# Patient Record
Sex: Female | Born: 1960 | Race: White | Hispanic: No | Marital: Single | Smoking: Former smoker
Health system: Southern US, Community
[De-identification: ages and names within clinical notes are randomized; demographics above are authoritative.]

## PROBLEM LIST (undated history)

## (undated) DIAGNOSIS — Z951 Presence of aortocoronary bypass graft: Secondary | ICD-10-CM

## (undated) DIAGNOSIS — I2510 Atherosclerotic heart disease of native coronary artery without angina pectoris: Secondary | ICD-10-CM

## (undated) DIAGNOSIS — I639 Cerebral infarction, unspecified: Secondary | ICD-10-CM

## (undated) DIAGNOSIS — I10 Essential (primary) hypertension: Secondary | ICD-10-CM

## (undated) HISTORY — PX: CARDIAC SURGERY: SHX584

---

## 2013-04-02 HISTORY — PX: CORONARY ARTERY BYPASS GRAFT: SHX141

## 2013-05-10 ENCOUNTER — Emergency Department (HOSPITAL_BASED_OUTPATIENT_CLINIC_OR_DEPARTMENT_OTHER)
Admission: EM | Admit: 2013-05-10 | Discharge: 2013-05-10 | Disposition: A | Discharge status: Home / Self Care | Payer: No Typology Code available for payment source | Attending: Emergency Medicine | Admitting: Emergency Medicine

## 2013-05-10 ENCOUNTER — Emergency Department (HOSPITAL_BASED_OUTPATIENT_CLINIC_OR_DEPARTMENT_OTHER): Payer: No Typology Code available for payment source

## 2013-05-10 ENCOUNTER — Encounter (HOSPITAL_BASED_OUTPATIENT_CLINIC_OR_DEPARTMENT_OTHER): Payer: Self-pay | Admitting: Emergency Medicine

## 2013-05-10 DIAGNOSIS — Z79899 Other long term (current) drug therapy: Secondary | ICD-10-CM | POA: Insufficient documentation

## 2013-05-10 DIAGNOSIS — IMO0002 Reserved for concepts with insufficient information to code with codable children: Secondary | ICD-10-CM | POA: Insufficient documentation

## 2013-05-10 DIAGNOSIS — S39012A Strain of muscle, fascia and tendon of lower back, initial encounter: Secondary | ICD-10-CM

## 2013-05-10 DIAGNOSIS — S0993XA Unspecified injury of face, initial encounter: Secondary | ICD-10-CM | POA: Insufficient documentation

## 2013-05-10 DIAGNOSIS — S199XXA Unspecified injury of neck, initial encounter: Secondary | ICD-10-CM

## 2013-05-10 DIAGNOSIS — S8000XA Contusion of unspecified knee, initial encounter: Secondary | ICD-10-CM | POA: Insufficient documentation

## 2013-05-10 DIAGNOSIS — I10 Essential (primary) hypertension: Secondary | ICD-10-CM | POA: Insufficient documentation

## 2013-05-10 DIAGNOSIS — Y9389 Activity, other specified: Secondary | ICD-10-CM | POA: Insufficient documentation

## 2013-05-10 DIAGNOSIS — Y92410 Unspecified street and highway as the place of occurrence of the external cause: Secondary | ICD-10-CM | POA: Insufficient documentation

## 2013-05-10 HISTORY — DX: Essential (primary) hypertension: I10

## 2013-05-10 MED ORDER — OXYCODONE-ACETAMINOPHEN 5-325 MG PO TABS: 2.0000 | ORAL_TABLET | ORAL | Status: DC | PRN

## 2013-05-10 MED ORDER — CYCLOBENZAPRINE HCL 10 MG PO TABS: 10.0000 mg | ORAL_TABLET | Freq: Two times a day (BID) | ORAL | Status: DC | PRN

## 2013-05-10 MED ORDER — IBUPROFEN 600 MG PO TABS: 600.0000 mg | ORAL_TABLET | Freq: Four times a day (QID) | ORAL | Status: DC | PRN
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
## 2013-05-10 NOTE — ED Notes (Signed)
Apologized for extended wait times.  Comfort measures provided to patient.

## 2013-05-10 NOTE — ED Notes (Signed)
MD at bedside. 
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
## 2015-12-21 ENCOUNTER — Encounter (HOSPITAL_BASED_OUTPATIENT_CLINIC_OR_DEPARTMENT_OTHER): Payer: Self-pay

## 2015-12-21 ENCOUNTER — Emergency Department (HOSPITAL_BASED_OUTPATIENT_CLINIC_OR_DEPARTMENT_OTHER)
Admission: EM | Admit: 2015-12-21 | Discharge: 2015-12-21 | Disposition: A | Discharge status: Home / Self Care | Payer: Self-pay | Attending: Emergency Medicine | Admitting: Emergency Medicine

## 2015-12-21 ENCOUNTER — Emergency Department (HOSPITAL_BASED_OUTPATIENT_CLINIC_OR_DEPARTMENT_OTHER): Payer: Self-pay

## 2015-12-21 DIAGNOSIS — R1011 Right upper quadrant pain: Secondary | ICD-10-CM

## 2015-12-21 DIAGNOSIS — K8051 Calculus of bile duct without cholangitis or cholecystitis with obstruction: Secondary | ICD-10-CM | POA: Insufficient documentation

## 2015-12-21 DIAGNOSIS — K8050 Calculus of bile duct without cholangitis or cholecystitis without obstruction: Secondary | ICD-10-CM

## 2015-12-21 DIAGNOSIS — Z79899 Other long term (current) drug therapy: Secondary | ICD-10-CM | POA: Insufficient documentation

## 2015-12-21 DIAGNOSIS — N390 Urinary tract infection, site not specified: Secondary | ICD-10-CM | POA: Insufficient documentation

## 2015-12-21 DIAGNOSIS — I10 Essential (primary) hypertension: Secondary | ICD-10-CM | POA: Insufficient documentation

## 2015-12-21 DIAGNOSIS — Z87891 Personal history of nicotine dependence: Secondary | ICD-10-CM | POA: Insufficient documentation

## 2015-12-21 HISTORY — DX: Cerebral infarction, unspecified: I63.9

## 2015-12-21 HISTORY — DX: Presence of aortocoronary bypass graft: Z95.1

## 2015-12-21 HISTORY — DX: Atherosclerotic heart disease of native coronary artery without angina pectoris: I25.10

## 2015-12-21 LAB — COMPREHENSIVE METABOLIC PANEL
ALBUMIN: 4.1 g/dL (ref 3.5–5.0)
ALK PHOS: 70 U/L (ref 38–126)
ALT: 20 U/L (ref 14–54)
AST: 24 U/L (ref 15–41)
Anion gap: 7 (ref 5–15)
BUN: 14 mg/dL (ref 6–20)
CALCIUM: 9.4 mg/dL (ref 8.9–10.3)
CHLORIDE: 106 mmol/L (ref 101–111)
CO2: 27 mmol/L (ref 22–32)
CREATININE: 0.96 mg/dL (ref 0.44–1.00)
GFR calc Af Amer: >60 mL/min (ref >60)
GFR calc non Af Amer: >60 mL/min (ref >60)
GLUCOSE: 98 mg/dL (ref 65–99)
Potassium: 3.3 mmol/L — ABNORMAL LOW (ref 3.5–5.1)
Sodium: 140 mmol/L (ref 135–145)
Total Bilirubin: 0.2 mg/dL — ABNORMAL LOW (ref 0.3–1.2)
Total Protein: 7.1 g/dL (ref 6.5–8.1)

## 2015-12-21 LAB — URINALYSIS, ROUTINE W REFLEX MICROSCOPIC
BILIRUBIN URINE: NEGATIVE
Glucose, UA: NEGATIVE mg/dL
Hgb urine dipstick: NEGATIVE
Ketones, ur: NEGATIVE mg/dL
NITRITE: POSITIVE — AB
PH: 5.5 (ref 5.0–8.0)
Protein, ur: NEGATIVE mg/dL
SPECIFIC GRAVITY, URINE: 1.014 (ref 1.005–1.030)

## 2015-12-21 LAB — CBC WITH DIFFERENTIAL/PLATELET
BASOS ABS: 0 10*3/uL (ref 0.0–0.1)
Basophils Relative: 0 %
Eosinophils Absolute: 0.3 10*3/uL (ref 0.0–0.7)
Eosinophils Relative: 5 %
HCT: 39.9 % (ref 36.0–46.0)
HEMOGLOBIN: 12.8 g/dL (ref 12.0–15.0)
LYMPHS ABS: 2 10*3/uL (ref 0.7–4.0)
Lymphocytes Relative: 40 %
MCH: 25.4 pg — ABNORMAL LOW (ref 26.0–34.0)
MCHC: 32.1 g/dL (ref 30.0–36.0)
MCV: 79.2 fL (ref 78.0–100.0)
Monocytes Absolute: 0.5 10*3/uL (ref 0.1–1.0)
Monocytes Relative: 9 %
Neutro Abs: 2.2 10*3/uL (ref 1.7–7.7)
Neutrophils Relative %: 46 %
PLATELETS: 213 10*3/uL (ref 150–400)
RBC: 5.04 MIL/uL (ref 3.87–5.11)
RDW: 15.5 % (ref 11.5–15.5)
WBC: 5 10*3/uL (ref 4.0–10.5)

## 2015-12-21 LAB — LIPASE, BLOOD: Lipase: 31 U/L (ref 11–51)

## 2015-12-21 LAB — URINE MICROSCOPIC-ADD ON

## 2015-12-21 LAB — MAGNESIUM: Magnesium: 1.8 mg/dL (ref 1.7–2.4)

## 2015-12-21 MED ORDER — ONDANSETRON HCL 4 MG PO TABS: 4.0000 mg | ORAL_TABLET | Freq: Three times a day (TID) | ORAL | 0 refills | Status: DC | PRN

## 2015-12-21 MED ORDER — CEPHALEXIN 500 MG PO CAPS: 500.0000 mg | ORAL_CAPSULE | Freq: Three times a day (TID) | ORAL | 0 refills | Status: DC

## 2015-12-21 MED ORDER — OXYCODONE-ACETAMINOPHEN 5-325 MG PO TABS: 1.0000 | ORAL_TABLET | ORAL | 0 refills | Status: DC | PRN

## 2015-12-21 MED ORDER — MORPHINE SULFATE (PF) 4 MG/ML IV SOLN
4.0000 mg | Freq: Once | INTRAVENOUS | Status: AC
  Administered 2015-12-21: 4 mg via INTRAVENOUS
  Filled 2015-12-21: qty 1

## 2015-12-21 MED ORDER — SODIUM CHLORIDE 0.9 % IV BOLUS (SEPSIS)
500.0000 mL | Freq: Once | INTRAVENOUS | Status: AC
  Administered 2015-12-21: 500 mL via INTRAVENOUS

## 2015-12-21 MED ORDER — DEXTROSE 5 % IV SOLN
1.0000 g | Freq: Once | INTRAVENOUS | Status: AC
  Administered 2015-12-21: 1 g via INTRAVENOUS
  Filled 2015-12-21: qty 10

## 2015-12-21 MED ORDER — ONDANSETRON HCL 4 MG/2ML IJ SOLN
4.0000 mg | Freq: Once | INTRAMUSCULAR | Status: AC
  Administered 2015-12-21: 4 mg via INTRAVENOUS
  Filled 2015-12-21: qty 2

## 2015-12-21 NOTE — ED Notes (Signed)
MD at bedside. 

## 2015-12-21 NOTE — ED Notes (Signed)
Patient transported to Ultrasound 

## 2015-12-21 NOTE — ED Provider Notes (Signed)
[facility]   CSN: [id number] Arrival date & time: 12/21/15  [id number]     History   Chief Complaint Chief Complaint  Patient presents with  . Abdominal Pain    HPI Linda Pruitt is a 55 y.o. female.  HPI Patient presents with right upper quadrant pain radiating to her right flank. States this is the same pain as her previously diagnosed biliary colic. States she is scheduled to have cholecystectomy in 5 days with Dr. Rubye OaksPalmer. Complains of nausea but no vomiting. No fever or chills. Took her hydrocodone at home with little relief. Last ate at 8 PM last night. States she's seen her cardiologist and receive cardiac clearance. Past Medical History:  Diagnosis Date  . Coronary artery disease   . Hx of CABG   . Hypertension   . Stroke [facility](HCC)     There are no active problems to display for this patient.   Past Surgical History:  Procedure Laterality Date  . CARDIAC SURGERY      OB History    No data available       Home Medications    Prior to Admission medications   Medication Sig Start Date End Date Taking? Authorizing Provider  cephALEXin (KEFLEX) 500 MG capsule Take 1 capsule (500 mg total) by mouth 3 (three) times daily. 12/21/15   Loren Raceravid Reeve Mallo, MD  cyclobenzaprine (FLEXERIL) 10 MG tablet Take 1 tablet (10 mg total) by mouth 2 (two) times daily as needed for muscle spasms. 05/10/13   Nelva Nayobert Beaton, MD  ibuprofen (ADVIL,MOTRIN) 600 MG tablet Take 1 tablet (600 mg total) by mouth every 6 (six) hours as needed. 05/10/13   Nelva Nayobert Beaton, MD  lisinopril (PRINIVIL,ZESTRIL) 20 MG tablet Take 20 mg by mouth daily.    Historical Provider, MD  ondansetron (ZOFRAN) 4 MG tablet Take 1 tablet (4 mg total) by mouth every 8 (eight) hours as needed for nausea or vomiting. 12/21/15   Loren Raceravid Louretta Tantillo, MD  oxyCODONE-acetaminophen (PERCOCET) 5-325 MG tablet Take 1 tablet by mouth every 4 (four) hours as needed. 12/21/15   Loren Raceravid Karesa Maultsby, MD    Family History No  family history on file.  Social History Social History  Substance Use Topics  . Smoking status: Former Games developermoker  . Smokeless tobacco: Never Used  . Alcohol use No     Allergies   Vicodin [hydrocodone-acetaminophen]   Review of Systems Review of Systems  Constitutional: Negative for chills and fever.  Respiratory: Negative for shortness of breath.   Cardiovascular: Negative for chest pain.  Gastrointestinal: Positive for abdominal pain and nausea. Negative for constipation, diarrhea and vomiting.  Genitourinary: Positive for flank pain. Negative for dysuria, frequency and hematuria.  Musculoskeletal: Positive for back pain and myalgias.  Neurological: Negative for dizziness, weakness, light-headedness, numbness and headaches.     Physical Exam Updated Vital Signs BP 132/74 (BP Location: Left Arm)   Pulse (!) 52   Temp 98.2 F (36.8 C) (Oral)   Resp 17   Ht 5\' 5"  (1.651 m)   Wt 183 lb (83 kg)   LMP 04/23/2013   SpO2 99%   BMI 30.45 kg/m   Physical Exam   ED Treatments / Results  Labs (all labs ordered are listed, but only abnormal results are displayed) Labs Reviewed  URINALYSIS, ROUTINE W REFLEX MICROSCOPIC (NOT [facility]) - Abnormal; Notable for the following:       Result Value   APPearance CLOUDY (*)    Nitrite POSITIVE (*)  Leukocytes, UA TRACE (*)    All other components within normal limits  URINE MICROSCOPIC-ADD ON - Abnormal; Notable for the following:    Squamous Epithelial / LPF 6-30 (*)    Bacteria, UA MANY (*)    All other components within normal limits  CBC WITH DIFFERENTIAL/PLATELET - Abnormal; Notable for the following:    MCH 25.4 (*)    All other components within normal limits  COMPREHENSIVE METABOLIC PANEL - Abnormal; Notable for the following:    Potassium 3.3 (*)    Total Bilirubin 0.2 (*)    All other components within normal limits  LIPASE, BLOOD  MAGNESIUM    EKG  EKG Interpretation None       Radiology US Abdomen  Limited Ruq  Result Date: 12/21/2015 CLINICAL DATA:  Right upper quadrant abdominal pain. History of gallstones. EXAM: US ABDOMEN LIMITED - RIGHT UPPER QUADRANT COMPARISON:  Ultrasound dated 11/19/2015 FINDINGS: Gallbladder: Multiple gallstones as previously noted. Negative sonographic Murphy sign. Wall thickness is normal. Common bile duct: Diameter: 3.5 mm, normal. Liver: No focal lesion identified. Within normal limits in parenchymal echogenicity. IMPRESSION: Chronic cholelithiasis.  No change since the prior study. Electronically Signed   By: Francene Boyers M.D.   On: 12/21/2015 08:14    Procedures Procedures (including critical care time)  Medications Ordered in ED Medications  morphine 4 MG/ML injection 4 mg (4 mg Intravenous Given 12/21/15 0738)  ondansetron (ZOFRAN) injection 4 mg (4 mg Intravenous Given 12/21/15 0738)  sodium chloride 0.9 % bolus 500 mL (0 mLs Intravenous Stopped 12/21/15 0922)  cefTRIAXone (ROCEPHIN) 1 g in dextrose 5 % 50 mL IVPB (0 g Intravenous Stopped 12/21/15 1029)     Initial Impression / Assessment and Plan / ED Course  I have reviewed the triage vital signs and the nursing notes.  Pertinent labs & imaging results that were available during my care of the patient were reviewed by me and considered in my medical decision making (see chart for details).  Clinical Course  Value Comment By Time  Neutrophils: 46 (Reviewed) Loren Racer, MD 09/20 [phone]    Patient's pain is improved significantly. No evidence of cholecystitis. Patient does have a UTI on UA. Denies urinary symptoms. Has right-sided CVA tenderness with percussion. She attributes this to her biliary colic but still present after improvement of her abdominal pain. There is some concern for early pallor fried's. Given IV dose of Rocephin in the emergency department and will discharge home with Keflex. She is advised to follow-up with her primary physician and she will ensure improvement of her urinary  tract infection. She has appointment to follow-up with her surgeon for cholecystectomy. She's been given return precautions and has voiced understanding.  Final Clinical Impressions(s) / ED Diagnoses   Final diagnoses:  Recurrent biliary colic  UTI (lower urinary tract infection)    New Prescriptions New Prescriptions   CEPHALEXIN (KEFLEX) 500 MG CAPSULE    Take 1 capsule (500 mg total) by mouth 3 (three) times daily.   ONDANSETRON (ZOFRAN) 4 MG TABLET    Take 1 tablet (4 mg total) by mouth every 8 (eight) hours as needed for nausea or vomiting.   OXYCODONE-ACETAMINOPHEN (PERCOCET) 5-325 MG TABLET    Take 1 tablet by mouth every 4 (four) hours as needed.     Loren Racer, MD 12/21/15 [phone]

## 2015-12-21 NOTE — ED Triage Notes (Signed)
Pt states has scheduled for gallbladder surgery on Monday. C/o RUQ pain since 2am, denies vomiting

## 2016-03-18 NOTE — ED Provider Notes (Signed)
Physical Exam 12/21/15  Physical Exam  Constitutional: She is oriented to person, place, and time and well-developed, well-nourished, and in no distress. No distress.  HENT:  Head: Normocephalic and atraumatic.  Mouth/Throat: No oropharyngeal exudate.  Eyes: EOM are normal. Pupils are equal, round, and reactive to light. Right eye exhibits no discharge. Left eye exhibits no discharge.  Neck: Normal range of motion. Neck supple.  Cardiovascular: Normal rate and regular rhythm.  Exam reveals no gallop and no friction rub.   No murmur heard. Pulmonary/Chest: Breath sounds normal. No respiratory distress. She has no wheezes. She has no rales. She exhibits no tenderness.  Abdominal: Soft. Bowel sounds are normal. She exhibits no distension and no mass. There is tenderness (right upper quadrant tenderness to palpation.). There is no rebound and no guarding.  Musculoskeletal: Normal range of motion. She exhibits tenderness. She exhibits no edema or deformity.  Right CVA tenderness to percussion. No lower extremity swelling, asymmetry or tenderness. Distal pulses intact.  Neurological: She is alert and oriented to person, place, and time.  Moves all extremities without deficit. Sensation intact.  Skin: Skin is warm and dry. No rash noted. She is not diaphoretic. No erythema. No pallor.  Psychiatric: Affect normal.     Loren Raceravid Chantelle Verdi, MD 03/18/16 [phone]

## 2018-02-18 IMAGING — US US ABDOMEN LIMITED
1 series · 14 of 25 positions shown · non-contrast
Comparison: Ultrasound dated 11/19/2015

CLINICAL DATA: Right upper quadrant abdominal pain. History of
gallstones.

EXAM:
US ABDOMEN LIMITED - RIGHT UPPER QUADRANT

[Series 1: us abdomen limited · 0.17mm/px · 14 of 63 slices shown]
[im 1/63]
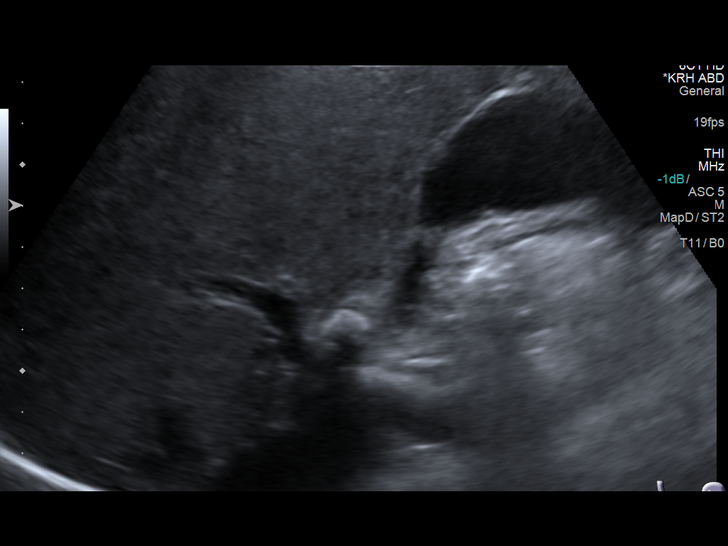
[im 6/63]
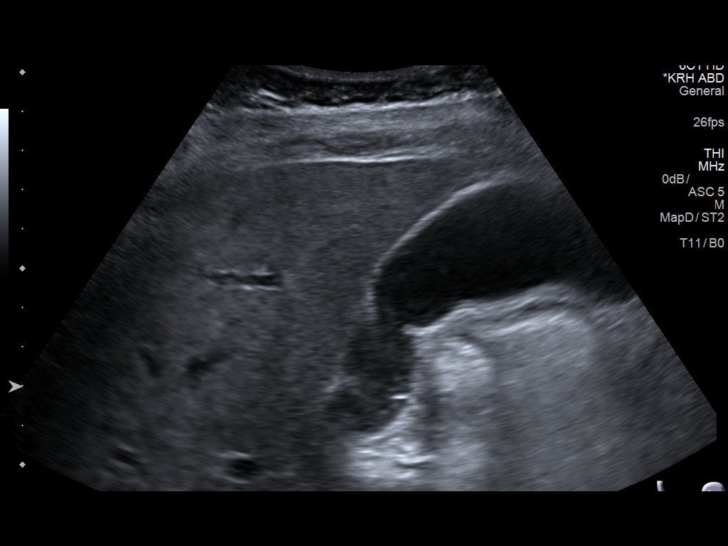
[im 11/63]
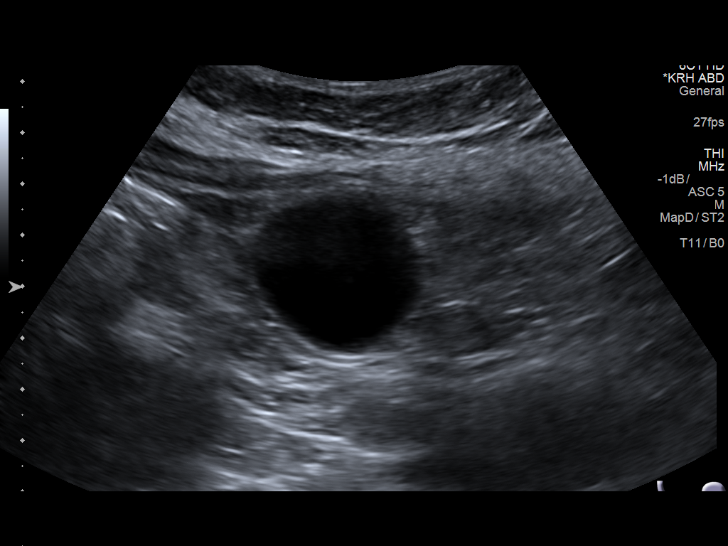
[im 16/63]
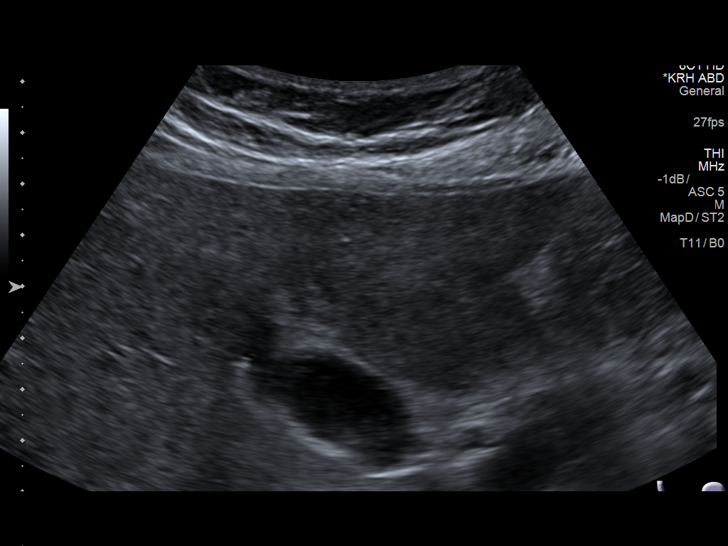
[im 21/63]
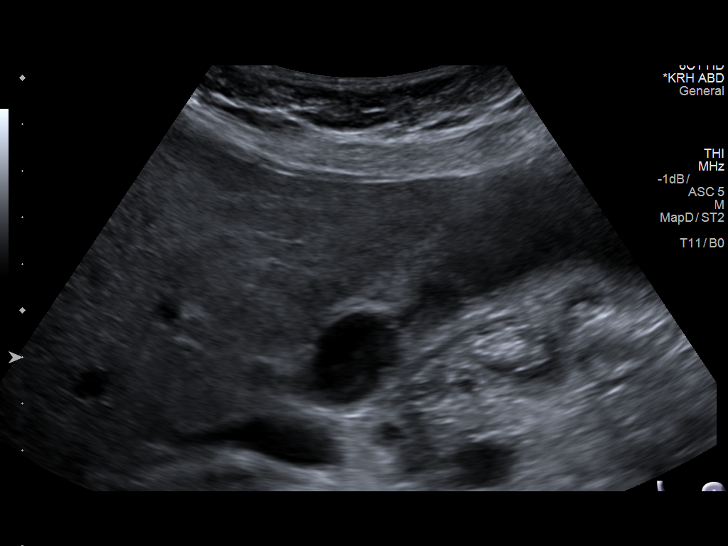
[im 24/63]
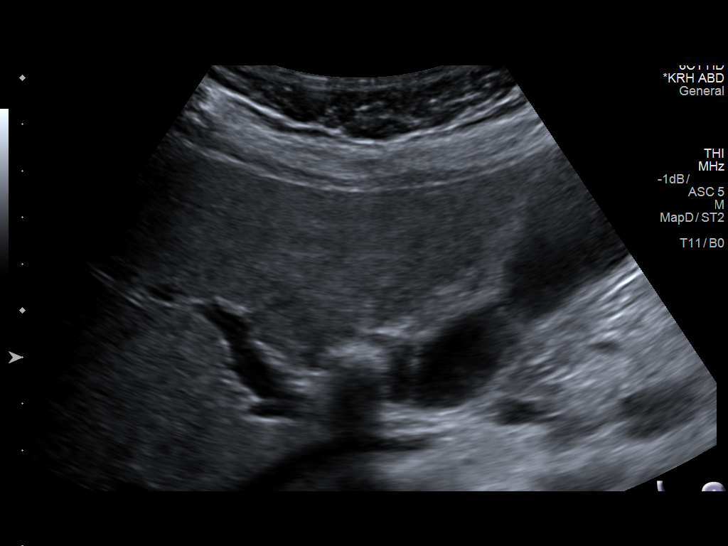
[im 29/63]
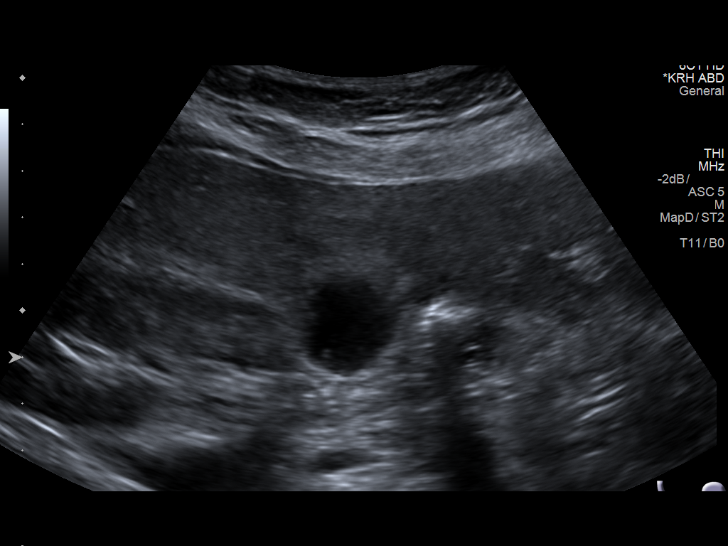
[im 34/63]
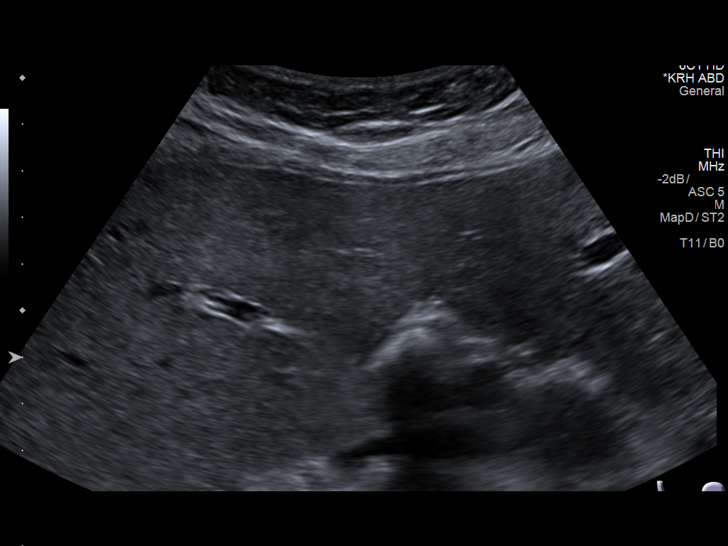
[im 39/63]
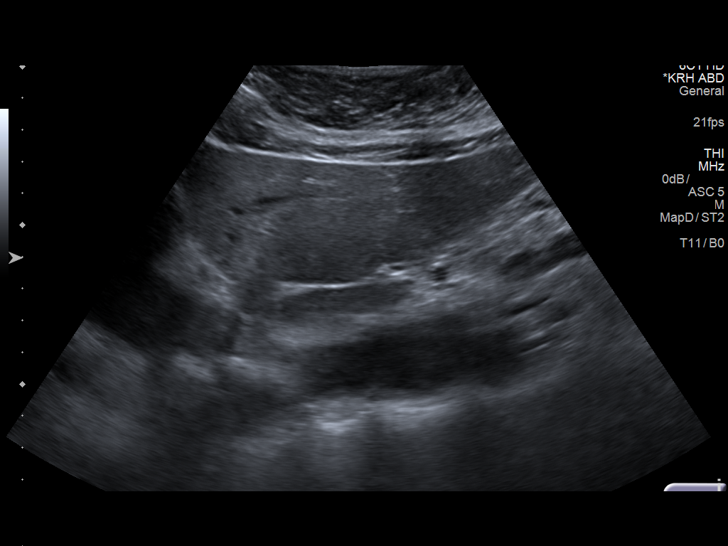
[im 42/63]
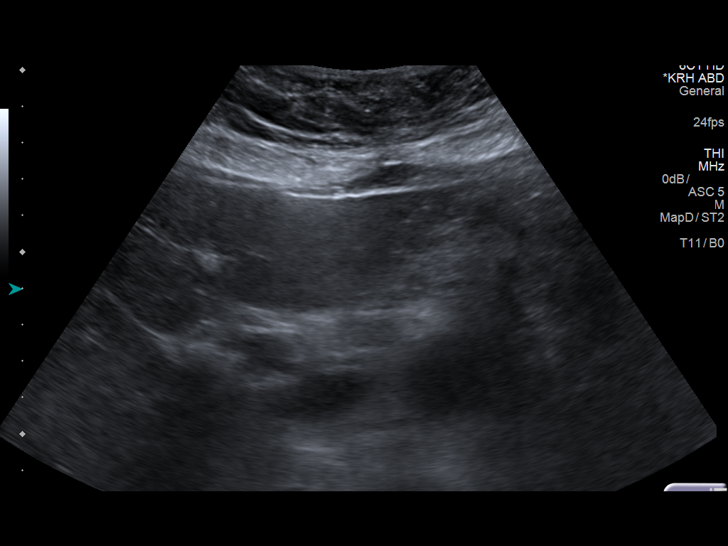
[im 47/63]
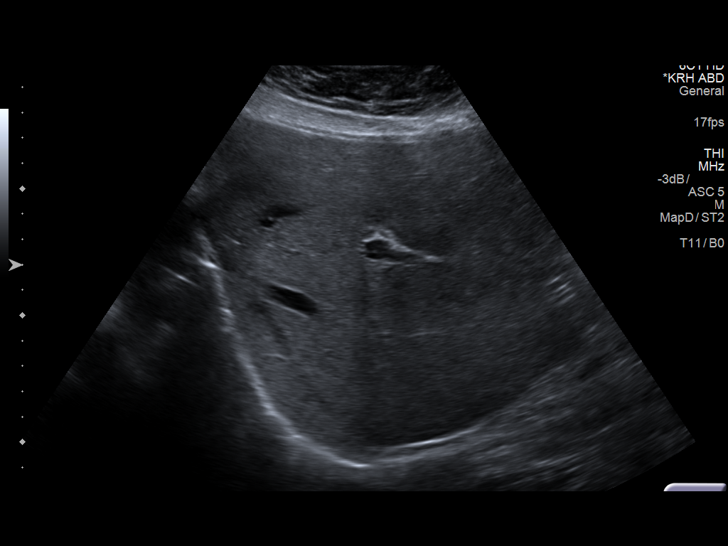
[im 52/63]
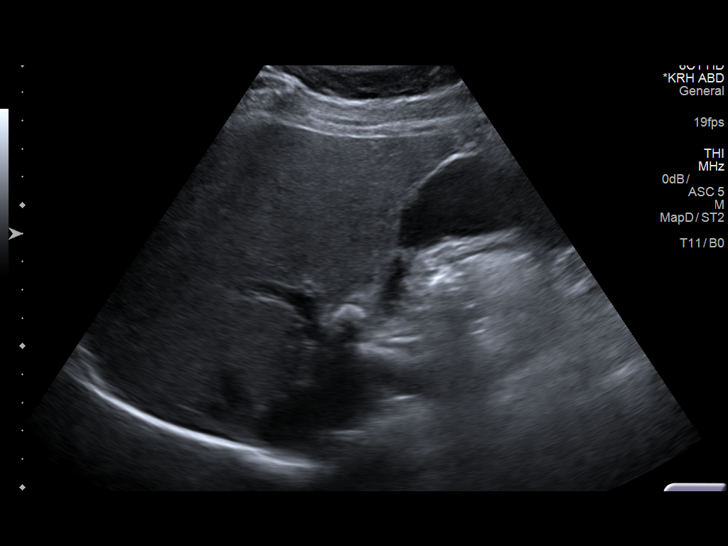
[im 57/63]
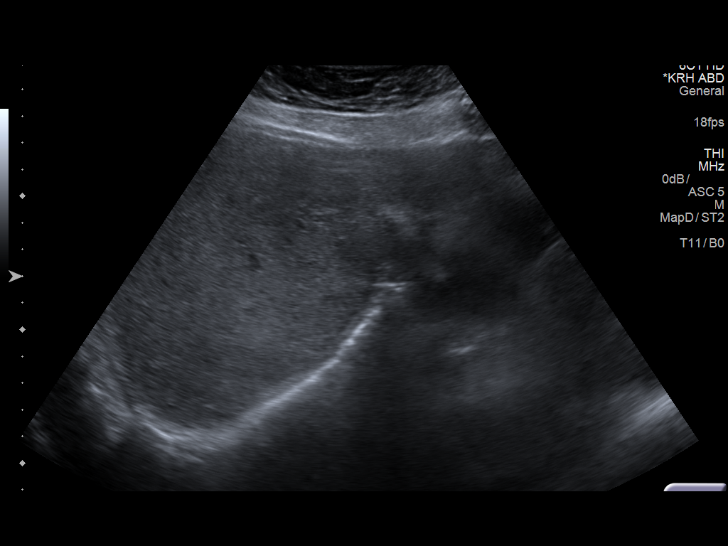
[im 63/63]
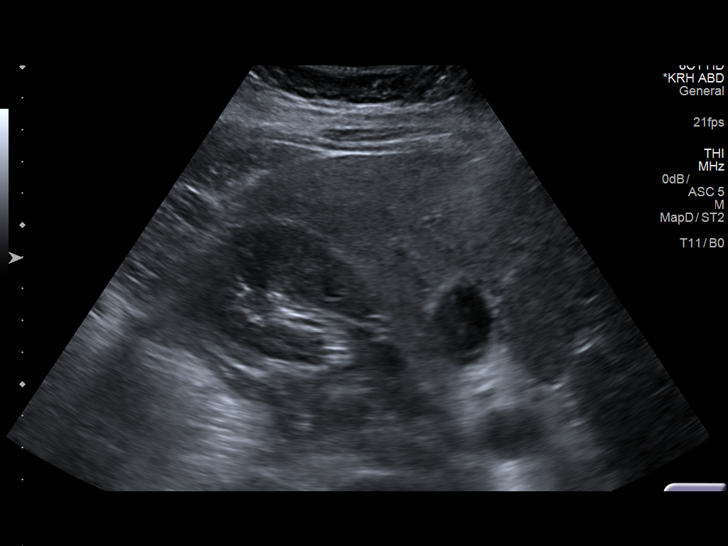

[14 of 25 positions shown; findings below may reference images not displayed]

FINDINGS: Gallbladder:

Multiple gallstones as previously noted. Negative sonographic Murphy
sign. Wall thickness is normal.

Common bile duct:

Diameter: 3.5 mm, normal.

Liver:

No focal lesion identified. Within normal limits in parenchymal
echogenicity.
IMPRESSION: Chronic cholelithiasis.  No change since the prior study.

## 2020-12-08 ENCOUNTER — Other Ambulatory Visit: Payer: Self-pay

## 2020-12-08 ENCOUNTER — Emergency Department (HOSPITAL_BASED_OUTPATIENT_CLINIC_OR_DEPARTMENT_OTHER): Payer: Medicaid Other

## 2020-12-08 ENCOUNTER — Encounter (HOSPITAL_BASED_OUTPATIENT_CLINIC_OR_DEPARTMENT_OTHER): Payer: Self-pay

## 2020-12-08 ENCOUNTER — Emergency Department (HOSPITAL_BASED_OUTPATIENT_CLINIC_OR_DEPARTMENT_OTHER)
Admission: EM | Admit: 2020-12-08 | Discharge: 2020-12-09 | Disposition: A | Discharge status: Home / Self Care | Payer: Medicaid Other | Attending: Emergency Medicine | Admitting: Emergency Medicine

## 2020-12-08 DIAGNOSIS — I2510 Atherosclerotic heart disease of native coronary artery without angina pectoris: Secondary | ICD-10-CM | POA: Diagnosis not present

## 2020-12-08 DIAGNOSIS — Z79899 Other long term (current) drug therapy: Secondary | ICD-10-CM | POA: Insufficient documentation

## 2020-12-08 DIAGNOSIS — M542 Cervicalgia: Secondary | ICD-10-CM | POA: Insufficient documentation

## 2020-12-08 DIAGNOSIS — R0789 Other chest pain: Secondary | ICD-10-CM | POA: Diagnosis not present

## 2020-12-08 DIAGNOSIS — I10 Essential (primary) hypertension: Secondary | ICD-10-CM | POA: Diagnosis not present

## 2020-12-08 DIAGNOSIS — Z951 Presence of aortocoronary bypass graft: Secondary | ICD-10-CM | POA: Insufficient documentation

## 2020-12-08 DIAGNOSIS — R079 Chest pain, unspecified: Secondary | ICD-10-CM | POA: Diagnosis present

## 2020-12-08 DIAGNOSIS — Z87891 Personal history of nicotine dependence: Secondary | ICD-10-CM | POA: Insufficient documentation

## 2020-12-08 LAB — CBC
HCT: 41 % (ref 36.0–46.0)
Hemoglobin: 13.1 g/dL (ref 12.0–15.0)
MCH: 26.6 pg (ref 26.0–34.0)
MCHC: 32 g/dL (ref 30.0–36.0)
MCV: 83.3 fL (ref 80.0–100.0)
Platelets: 229 10*3/uL (ref 150–400)
RBC: 4.92 MIL/uL (ref 3.87–5.11)
RDW: 13.8 % (ref 11.5–15.5)
WBC: 6 10*3/uL (ref 4.0–10.5)
nRBC: 0 % (ref 0.0–0.2)

## 2020-12-08 LAB — TROPONIN I (HIGH SENSITIVITY)
Troponin I (High Sensitivity): 16 ng/L (ref <18)
Troponin I (High Sensitivity): 15 ng/L (ref <18)

## 2020-12-08 LAB — BASIC METABOLIC PANEL
Anion gap: 8 (ref 5–15)
BUN: 15 mg/dL (ref 6–20)
CO2: 25 mmol/L (ref 22–32)
Calcium: 9.1 mg/dL (ref 8.9–10.3)
Chloride: 107 mmol/L (ref 98–111)
Creatinine, Ser: 0.91 mg/dL (ref 0.44–1.00)
GFR, Estimated: >60 mL/min (ref >60)
Glucose, Bld: 108 mg/dL — ABNORMAL HIGH (ref 70–99)
Potassium: 3.2 mmol/L — ABNORMAL LOW (ref 3.5–5.1)
Sodium: 140 mmol/L (ref 135–145)

## 2020-12-08 MED ORDER — AMLODIPINE BESYLATE 5 MG PO TABS
10.0000 mg | ORAL_TABLET | Freq: Once | ORAL | Status: AC
  Administered 2020-12-09: 10 mg via ORAL
  Filled 2020-12-08: qty 2

## 2020-12-08 MED ORDER — CYCLOBENZAPRINE HCL 10 MG PO TABS
10.0000 mg | ORAL_TABLET | Freq: Once | ORAL | Status: AC
  Administered 2020-12-09: 10 mg via ORAL
  Filled 2020-12-08: qty 1

## 2020-12-08 MED ORDER — HYDROCODONE-ACETAMINOPHEN 5-325 MG PO TABS
1.0000 | ORAL_TABLET | Freq: Once | ORAL | Status: AC
  Administered 2020-12-09: 1 via ORAL
  Filled 2020-12-08: qty 1

## 2020-12-08 MED ORDER — CARVEDILOL 12.5 MG PO TABS
25.0000 mg | ORAL_TABLET | Freq: Once | ORAL | Status: AC
  Administered 2020-12-09: 25 mg via ORAL
  Filled 2020-12-08: qty 2

## 2020-12-08 NOTE — ED Provider Notes (Signed)
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  The history is provided by the patient and medical records.  Chest Pain Linda Pruitt is a 60 y.o. female who presents to the Emergency Department complaining of chest pain. She presents to the ED for evaluation of left sided chest and neck pain.  Pain started around noon when she was at rest.  She describes it as a sharp spasm.  Pain is worse with movement.  Pain started in neck and spread to left chest/shoulder/upper back.    No recent injuries.    No fever, cough, sob, N/V, abdominal pain, diaphoresis.  No prior similar sxs.  No leg swelling.  No HA, numbness/weakness.  Saw PCP last week.  Takes carvedilol 50mg  am, 25mg  pm, amlodipine 10, losartan Missed BP meds for two days (forgot to take them).    Past Medical History:  Diagnosis Date   Coronary artery disease    Hx of CABG    Hypertension    Stroke [facility])     There are no problems to display for this patient.   Past Surgical History:  Procedure Laterality Date   CARDIAC SURGERY       OB History   No obstetric history on file.     No family history on file.  Social History   Tobacco Use   Smoking status: Former    Types: Cigarettes   Smokeless tobacco: Never  Vaping Use   Vaping Use: Never used  Substance Use Topics   Alcohol use: No   Drug use: Never    Home Medications Prior to Admission medications   Medication Sig Start Date End Date Taking? Authorizing Provider  cyclobenzaprine (FLEXERIL) 10 MG tablet Take 1 tablet (10 mg total) by mouth 2 (two) times daily as needed for muscle spasms. 12/09/20  Yes [facility], MD  cephALEXin (KEFLEX) 500 MG capsule Take 1 capsule (500 mg total) by mouth 3 (three) times daily. 12/21/15   Tilden Fossa, MD  ibuprofen (ADVIL,MOTRIN) 600 MG  tablet Take 1 tablet (600 mg total) by mouth every 6 (six) hours as needed. 05/10/13   Loren Racer, MD  lisinopril (PRINIVIL,ZESTRIL) 20 MG tablet Take 20 mg by mouth daily.    [provider]  ondansetron (ZOFRAN) 4 MG tablet Take 1 tablet (4 mg total) by mouth every 8 (eight) hours as needed for nausea or vomiting. 12/21/15   Nelva Nay, MD  oxyCODONE-acetaminophen (PERCOCET) 5-325 MG tablet Take 1 tablet by mouth every 4 (four) hours as needed. 12/21/15   Loren Racer, MD    Allergies    Vicodin [hydrocodone-acetaminophen]  Review of Systems   Review of Systems  Cardiovascular:  Positive for chest pain.  All other systems reviewed and are negative.  Physical Exam Updated Vital Signs BP (!) 164/93 (BP Location: Left Arm)   Pulse 66   Temp 98.3 F (36.8 C) (Oral)   Resp 15   Ht 5\' 5"  (1.651 m)   Wt 90.7 kg   LMP 04/23/2013   SpO2 93%   BMI 33.28 kg/m   Physical Exam Vitals and nursing note reviewed.  Constitutional:      Appearance: She is well-developed.  HENT:     Head: Normocephalic and atraumatic.  Cardiovascular:     Rate and Rhythm: Normal  rate and regular rhythm.     Heart sounds: No murmur heard. Pulmonary:     Effort: Pulmonary effort is normal. No respiratory distress.     Breath sounds: Normal breath sounds.  Abdominal:     Palpations: Abdomen is soft.     Tenderness: There is no abdominal tenderness. There is no guarding or rebound.  Musculoskeletal:        General: No swelling or tenderness.     Cervical back: No rigidity.     Comments: 2+ radial and DP pulses bilaterally  Lymphadenopathy:     Cervical: No cervical adenopathy.  Skin:    General: Skin is warm and dry.  Neurological:     Mental Status: She is alert and oriented to person, place, and time.     Comments: 5/5 strength in all four extremities with sensation to light touch intact in all four extremities.   Psychiatric:        Behavior: Behavior normal.    ED Results /  Procedures / Treatments   Labs (all labs ordered are listed, but only abnormal results are displayed) Labs Reviewed  BASIC METABOLIC PANEL - Abnormal; Notable for the following components:      Result Value   Potassium 3.2 (*)    Glucose, Bld 108 (*)    All other components within normal limits  CBC  PREGNANCY, URINE  TROPONIN I (HIGH SENSITIVITY)  TROPONIN I (HIGH SENSITIVITY)    EKG EKG Interpretation  Date/Time:  Thursday December 08 2020 20:00:11 EDT Ventricular Rate:  72 PR Interval:  196 QRS Duration: 106 QT Interval:  434 QTC Calculation: 475 R Axis:   -17 Text Interpretation: Normal sinus rhythm Left ventricular hypertrophy with repolarization abnormality ( R in aVL , Cornell product , Romhilt-Estes ) Nonspecific ST abnormality in the inferior leads no prior EKGs for comparison Abnormal ECG Confirmed by Ernie Avena ([id number]) on 12/08/2020 8:03:38 PM  Radiology DG Chest 2 View  Result Date: 12/08/2020 CLINICAL DATA:  Chest pain. EXAM: CHEST - 2 VIEW COMPARISON:  October 14, 2020. FINDINGS: Stable cardiomediastinal silhouette. Both lungs are clear. Sternotomy wires are noted. The visualized skeletal structures are unremarkable. IMPRESSION: No active cardiopulmonary disease. Electronically Signed   By: Lupita Raider M.D.   On: 12/08/2020 20:34    Procedures Procedures   Medications Ordered in ED Medications  amLODipine (NORVASC) tablet 10 mg (10 mg Oral Given 12/09/20 0004)  carvedilol (COREG) tablet 25 mg (25 mg Oral Given 12/09/20 0005)  cyclobenzaprine (FLEXERIL) tablet 10 mg (10 mg Oral Given 12/09/20 0005)  HYDROcodone-acetaminophen (NORCO/VICODIN) 5-325 MG per tablet 1 tablet (1 tablet Oral Given 12/09/20 0004)    ED Course  I have reviewed the triage vital signs and the nursing notes.  Pertinent labs & imaging results that were available during my care of the patient were reviewed by me and considered in my medical decision making (see chart for details).    MDM  Rules/Calculators/A&P                           patient with history of hypertension, coronary artery disease here for evaluation of chest pain, left neck pain. She has not taken her blood pressure medications for the last two days. Pain is reproducible with positioning/movement. She has symmetric pulses. Pain improved after treatment in the emergency department including muscle relaxants in her home medications. Presentation is not consistent with ACS, dissection, hypertensive urgency. Plan to discharge  home with home care for muscle strain. Discussed importance of compliance to her blood pressure regimen. Return precautions discussed. Final Clinical Impression(s) / ED Diagnoses Final diagnoses:  Chest wall pain  Essential hypertension    Rx / DC Orders ED Discharge Orders          Ordered    cyclobenzaprine (FLEXERIL) 10 MG tablet  2 times daily PRN        12/09/20 0104             Tilden Fossa, MD 12/09/20 [id number]

## 2020-12-08 NOTE — ED Triage Notes (Addendum)
Pt c/o left side CP started ~3pm-denies fever/flu sx-c/o pain to left side of neck when she sat up ~12pm-pain to neck is worse with movement-NAD-to triage in w/c-pt screamed when brought into triage when w/c went over metal threshold-stating increase in neck pain

## 2020-12-09 MED ORDER — CYCLOBENZAPRINE HCL 10 MG PO TABS: 10.0000 mg | ORAL_TABLET | Freq: Two times a day (BID) | ORAL | 0 refills | Status: DC | PRN

## 2021-07-31 ENCOUNTER — Other Ambulatory Visit: Payer: Self-pay

## 2021-07-31 ENCOUNTER — Inpatient Hospital Stay (HOSPITAL_BASED_OUTPATIENT_CLINIC_OR_DEPARTMENT_OTHER)
Admission: EM | Admit: 2021-07-31 | Discharge: 2021-08-02 | DRG: 281 | Disposition: A | Discharge status: Home / Self Care | Payer: Medicaid Other | Attending: Cardiovascular Disease | Admitting: Cardiovascular Disease

## 2021-07-31 ENCOUNTER — Emergency Department (HOSPITAL_BASED_OUTPATIENT_CLINIC_OR_DEPARTMENT_OTHER): Payer: Medicaid Other

## 2021-07-31 ENCOUNTER — Encounter (HOSPITAL_BASED_OUTPATIENT_CLINIC_OR_DEPARTMENT_OTHER): Payer: Self-pay

## 2021-07-31 ENCOUNTER — Inpatient Hospital Stay (HOSPITAL_COMMUNITY)
Admission: EM | Disposition: A | Discharge status: Home / Self Care | Payer: Self-pay | Source: Home / Self Care | Attending: Cardiovascular Disease

## 2021-07-31 ENCOUNTER — Observation Stay (HOSPITAL_BASED_OUTPATIENT_CLINIC_OR_DEPARTMENT_OTHER): Payer: Medicaid Other

## 2021-07-31 DIAGNOSIS — I6930 Unspecified sequelae of cerebral infarction: Secondary | ICD-10-CM

## 2021-07-31 DIAGNOSIS — I214 Non-ST elevation (NSTEMI) myocardial infarction: Secondary | ICD-10-CM

## 2021-07-31 DIAGNOSIS — Z885 Allergy status to narcotic agent status: Secondary | ICD-10-CM

## 2021-07-31 DIAGNOSIS — I25110 Atherosclerotic heart disease of native coronary artery with unstable angina pectoris: Secondary | ICD-10-CM | POA: Diagnosis not present

## 2021-07-31 DIAGNOSIS — Z7982 Long term (current) use of aspirin: Secondary | ICD-10-CM | POA: Diagnosis not present

## 2021-07-31 DIAGNOSIS — E785 Hyperlipidemia, unspecified: Secondary | ICD-10-CM | POA: Diagnosis not present

## 2021-07-31 DIAGNOSIS — Z91040 Latex allergy status: Secondary | ICD-10-CM

## 2021-07-31 DIAGNOSIS — E876 Hypokalemia: Secondary | ICD-10-CM | POA: Diagnosis present

## 2021-07-31 DIAGNOSIS — I2510 Atherosclerotic heart disease of native coronary artery without angina pectoris: Secondary | ICD-10-CM

## 2021-07-31 DIAGNOSIS — I4729 Other ventricular tachycardia: Secondary | ICD-10-CM | POA: Clinically undetermined

## 2021-07-31 DIAGNOSIS — Z79899 Other long term (current) drug therapy: Secondary | ICD-10-CM

## 2021-07-31 DIAGNOSIS — I517 Cardiomegaly: Secondary | ICD-10-CM

## 2021-07-31 DIAGNOSIS — I10 Essential (primary) hypertension: Secondary | ICD-10-CM | POA: Diagnosis not present

## 2021-07-31 DIAGNOSIS — I252 Old myocardial infarction: Secondary | ICD-10-CM

## 2021-07-31 DIAGNOSIS — Z91041 Radiographic dye allergy status: Secondary | ICD-10-CM

## 2021-07-31 DIAGNOSIS — Z87891 Personal history of nicotine dependence: Secondary | ICD-10-CM | POA: Diagnosis not present

## 2021-07-31 DIAGNOSIS — I472 Ventricular tachycardia, unspecified: Secondary | ICD-10-CM | POA: Diagnosis present

## 2021-07-31 DIAGNOSIS — Z951 Presence of aortocoronary bypass graft: Secondary | ICD-10-CM | POA: Diagnosis not present

## 2021-07-31 DIAGNOSIS — Z91048 Other nonmedicinal substance allergy status: Secondary | ICD-10-CM | POA: Diagnosis not present

## 2021-07-31 DIAGNOSIS — R079 Chest pain, unspecified: Secondary | ICD-10-CM | POA: Diagnosis present

## 2021-07-31 DIAGNOSIS — I25119 Atherosclerotic heart disease of native coronary artery with unspecified angina pectoris: Secondary | ICD-10-CM | POA: Diagnosis present

## 2021-07-31 HISTORY — PX: LEFT HEART CATH AND CORS/GRAFTS ANGIOGRAPHY: CATH118250

## 2021-07-31 LAB — BASIC METABOLIC PANEL
Anion gap: 9 (ref 5–15)
BUN: 11 mg/dL (ref 6–20)
CO2: 34 mmol/L — ABNORMAL HIGH (ref 22–32)
Calcium: 10.1 mg/dL (ref 8.9–10.3)
Chloride: 102 mmol/L (ref 98–111)
Creatinine, Ser: 0.86 mg/dL (ref 0.44–1.00)
GFR, Estimated: >60 mL/min (ref >60)
Glucose, Bld: 122 mg/dL — ABNORMAL HIGH (ref 70–99)
Potassium: 2.6 mmol/L — CL (ref 3.5–5.1)
Sodium: 145 mmol/L (ref 135–145)

## 2021-07-31 LAB — APTT: aPTT: 32 seconds (ref 24–36)

## 2021-07-31 LAB — POCT I-STAT 7, (LYTES, BLD GAS, ICA,H+H)
Acid-Base Excess: 8 mmol/L — ABNORMAL HIGH (ref 0.0–2.0)
Bicarbonate: 33.6 mmol/L — ABNORMAL HIGH (ref 20.0–28.0)
Calcium, Ion: 1.18 mmol/L (ref 1.15–1.40)
HCT: 34 % — ABNORMAL LOW (ref 36.0–46.0)
Hemoglobin: 11.6 g/dL — ABNORMAL LOW (ref 12.0–15.0)
O2 Saturation: 92 %
Potassium: 3 mmol/L — ABNORMAL LOW (ref 3.5–5.1)
Sodium: 142 mmol/L (ref 135–145)
TCO2: 35 mmol/L — ABNORMAL HIGH (ref 22–32)
pCO2 arterial: 52.2 mmHg — ABNORMAL HIGH (ref 32–48)
pH, Arterial: 7.417 (ref 7.35–7.45)
pO2, Arterial: 64 mmHg — ABNORMAL LOW (ref 83–108)

## 2021-07-31 LAB — ECHOCARDIOGRAM COMPLETE
Area-P 1/2: 2.78 cm2
Calc EF: 61.1 %
Height: 65 in
S' Lateral: 2.4 cm
Single Plane A2C EF: 63.6 %
Single Plane A4C EF: 58.7 %
Weight: 3200 oz

## 2021-07-31 LAB — MAGNESIUM: Magnesium: 1.7 mg/dL (ref 1.7–2.4)

## 2021-07-31 LAB — PROTIME-INR
INR: 1 (ref 0.8–1.2)
Prothrombin Time: 13 seconds (ref 11.4–15.2)

## 2021-07-31 LAB — CBC
HCT: 39.6 % (ref 36.0–46.0)
Hemoglobin: 12.8 g/dL (ref 12.0–15.0)
MCH: 27.3 pg (ref 26.0–34.0)
MCHC: 32.3 g/dL (ref 30.0–36.0)
MCV: 84.4 fL (ref 80.0–100.0)
Platelets: 245 10*3/uL (ref 150–400)
RBC: 4.69 MIL/uL (ref 3.87–5.11)
RDW: 14 % (ref 11.5–15.5)
WBC: 5 10*3/uL (ref 4.0–10.5)
nRBC: 0 % (ref 0.0–0.2)

## 2021-07-31 LAB — CBG MONITORING, ED: Glucose-Capillary: 114 mg/dL — ABNORMAL HIGH (ref 70–99)

## 2021-07-31 LAB — TROPONIN I (HIGH SENSITIVITY): Troponin I (High Sensitivity): 231 ng/L (ref <18)

## 2021-07-31 SURGERY — LEFT HEART CATH AND CORS/GRAFTS ANGIOGRAPHY
Anesthesia: LOCAL

## 2021-07-31 MED ORDER — DIPHENHYDRAMINE HCL 50 MG/ML IJ SOLN
25.0000 mg | Freq: Once | INTRAMUSCULAR | Status: AC
  Administered 2021-07-31: 25 mg via INTRAVENOUS
  Filled 2021-07-31: qty 1

## 2021-07-31 MED ORDER — LIDOCAINE HCL (PF) 1 % IJ SOLN
INTRAMUSCULAR | Status: DC | PRN
  Administered 2021-07-31: 15 mL

## 2021-07-31 MED ORDER — SODIUM CHLORIDE 0.9 % WEIGHT BASED INFUSION
3.0000 mL/kg/h | INTRAVENOUS | Status: DC
  Administered 2021-07-31: 3 mL/kg/h via INTRAVENOUS

## 2021-07-31 MED ORDER — LIDOCAINE HCL (PF) 1 % IJ SOLN
INTRAMUSCULAR | Status: AC
  Filled 2021-07-31: qty 30

## 2021-07-31 MED ORDER — FENTANYL CITRATE (PF) 100 MCG/2ML IJ SOLN
INTRAMUSCULAR | Status: AC
  Filled 2021-07-31: qty 2

## 2021-07-31 MED ORDER — ATORVASTATIN CALCIUM 40 MG PO TABS
40.0000 mg | ORAL_TABLET | Freq: Every day | ORAL | Status: DC
  Administered 2021-07-31 – 2021-08-02 (×3): 40 mg via ORAL
  Filled 2021-07-31 (×3): qty 1

## 2021-07-31 MED ORDER — IOHEXOL 350 MG/ML SOLN
INTRAVENOUS | Status: DC | PRN
Start: 2021-07-31 — End: 2021-07-31
  Administered 2021-07-31: 110 mL

## 2021-07-31 MED ORDER — MIDAZOLAM HCL 2 MG/2ML IJ SOLN
INTRAMUSCULAR | Status: DC | PRN
  Administered 2021-07-31: 1 mg via INTRAVENOUS

## 2021-07-31 MED ORDER — SODIUM CHLORIDE 0.9 % WEIGHT BASED INFUSION: 1.0000 mL/kg/h | INTRAVENOUS | Status: DC

## 2021-07-31 MED ORDER — NITROGLYCERIN 0.4 MG SL SUBL
0.4000 mg | SUBLINGUAL_TABLET | SUBLINGUAL | Status: DC | PRN
  Administered 2021-07-31 (×2): 0.4 mg via SUBLINGUAL
  Filled 2021-07-31 (×2): qty 1

## 2021-07-31 MED ORDER — SODIUM CHLORIDE 0.9% FLUSH: 3.0000 mL | INTRAVENOUS | Status: DC | PRN

## 2021-07-31 MED ORDER — ASPIRIN 81 MG PO CHEW: 81.0000 mg | CHEWABLE_TABLET | Freq: Every day | ORAL | Status: DC

## 2021-07-31 MED ORDER — HEPARIN (PORCINE) 25000 UT/250ML-% IV SOLN
1100.0000 [IU]/h | INTRAVENOUS | Status: DC
  Administered 2021-07-31: 1100 [IU]/h via INTRAVENOUS
  Filled 2021-07-31: qty 250

## 2021-07-31 MED ORDER — AMLODIPINE BESYLATE 10 MG PO TABS
10.0000 mg | ORAL_TABLET | Freq: Every day | ORAL | Status: DC
  Administered 2021-07-31 – 2021-08-02 (×3): 10 mg via ORAL
  Filled 2021-07-31: qty 2
  Filled 2021-07-31 (×2): qty 1

## 2021-07-31 MED ORDER — POTASSIUM CHLORIDE 10 MEQ/100ML IV SOLN
INTRAVENOUS | Status: AC | PRN
  Administered 2021-07-31: 10 meq via INTRAVENOUS

## 2021-07-31 MED ORDER — ONDANSETRON HCL 4 MG/2ML IJ SOLN: 4.0000 mg | Freq: Four times a day (QID) | INTRAMUSCULAR | Status: DC | PRN

## 2021-07-31 MED ORDER — SODIUM CHLORIDE 0.9% FLUSH
3.0000 mL | Freq: Two times a day (BID) | INTRAVENOUS | Status: DC
  Administered 2021-07-31 – 2021-08-02 (×4): 3 mL via INTRAVENOUS

## 2021-07-31 MED ORDER — NITROGLYCERIN IN D5W 200-5 MCG/ML-% IV SOLN
0.0000 ug/min | INTRAVENOUS | Status: DC
  Administered 2021-07-31: 5 ug/min via INTRAVENOUS
  Filled 2021-07-31: qty 250

## 2021-07-31 MED ORDER — POTASSIUM CHLORIDE CRYS ER 20 MEQ PO TBCR
40.0000 meq | EXTENDED_RELEASE_TABLET | Freq: Once | ORAL | Status: AC
  Administered 2021-07-31: 40 meq via ORAL
  Filled 2021-07-31: qty 2

## 2021-07-31 MED ORDER — POTASSIUM CHLORIDE 10 MEQ/100ML IV SOLN
10.0000 meq | Freq: Once | INTRAVENOUS | Status: AC
  Administered 2021-07-31: 10 meq via INTRAVENOUS
  Filled 2021-07-31: qty 100

## 2021-07-31 MED ORDER — FAMOTIDINE IN NACL 20-0.9 MG/50ML-% IV SOLN
INTRAVENOUS | Status: AC | PRN
  Administered 2021-07-31: 20 mg via INTRAVENOUS

## 2021-07-31 MED ORDER — FAMOTIDINE IN NACL 20-0.9 MG/50ML-% IV SOLN
INTRAVENOUS | Status: AC
  Filled 2021-07-31: qty 50

## 2021-07-31 MED ORDER — CARVEDILOL 12.5 MG PO TABS
12.5000 mg | ORAL_TABLET | Freq: Two times a day (BID) | ORAL | Status: DC
  Administered 2021-08-01: 12.5 mg via ORAL
  Filled 2021-07-31: qty 1

## 2021-07-31 MED ORDER — MIDAZOLAM HCL 2 MG/2ML IJ SOLN
INTRAMUSCULAR | Status: AC
  Filled 2021-07-31: qty 2

## 2021-07-31 MED ORDER — ASPIRIN EC 81 MG PO TBEC
81.0000 mg | DELAYED_RELEASE_TABLET | Freq: Every day | ORAL | Status: DC
Start: 2021-07-31 — End: 2021-08-02
  Administered 2021-07-31 – 2021-08-02 (×3): 81 mg via ORAL
  Filled 2021-07-31 (×3): qty 1

## 2021-07-31 MED ORDER — LABETALOL HCL 5 MG/ML IV SOLN: 10.0000 mg | INTRAVENOUS | Status: AC | PRN

## 2021-07-31 MED ORDER — ASPIRIN 81 MG PO CHEW
324.0000 mg | CHEWABLE_TABLET | Freq: Once | ORAL | Status: AC
  Administered 2021-07-31: 324 mg via ORAL
  Filled 2021-07-31: qty 4

## 2021-07-31 MED ORDER — SODIUM CHLORIDE 0.9 % IV SOLN: INTRAVENOUS | Status: AC

## 2021-07-31 MED ORDER — HEPARIN (PORCINE) 25000 UT/250ML-% IV SOLN
1300.0000 [IU]/h | INTRAVENOUS | Status: AC
  Administered 2021-07-31: 1100 [IU]/h via INTRAVENOUS
  Administered 2021-08-01: 1300 [IU]/h via INTRAVENOUS
  Filled 2021-07-31 (×2): qty 250

## 2021-07-31 MED ORDER — ACETAMINOPHEN 325 MG PO TABS: 650.0000 mg | ORAL_TABLET | ORAL | Status: DC | PRN

## 2021-07-31 MED ORDER — METHYLPREDNISOLONE SODIUM SUCC 125 MG IJ SOLR
125.0000 mg | Freq: Once | INTRAMUSCULAR | Status: AC
  Administered 2021-07-31: 125 mg via INTRAVENOUS
  Filled 2021-07-31: qty 2

## 2021-07-31 MED ORDER — SODIUM CHLORIDE 0.9 % IV SOLN: 250.0000 mL | INTRAVENOUS | Status: DC | PRN

## 2021-07-31 MED ORDER — FENTANYL CITRATE (PF) 100 MCG/2ML IJ SOLN
INTRAMUSCULAR | Status: DC | PRN
  Administered 2021-07-31: 25 ug via INTRAVENOUS

## 2021-07-31 MED ORDER — HEPARIN (PORCINE) IN NACL 1000-0.9 UT/500ML-% IV SOLN
INTRAVENOUS | Status: AC
  Filled 2021-07-31: qty 1000

## 2021-07-31 MED ORDER — HEPARIN BOLUS VIA INFUSION
4000.0000 [IU] | Freq: Once | INTRAVENOUS | Status: AC
  Administered 2021-07-31: 4000 [IU] via INTRAVENOUS

## 2021-07-31 MED ORDER — HYDRALAZINE HCL 20 MG/ML IJ SOLN: 10.0000 mg | INTRAMUSCULAR | Status: AC | PRN

## 2021-07-31 MED ORDER — SODIUM CHLORIDE 0.9% FLUSH: 3.0000 mL | Freq: Two times a day (BID) | INTRAVENOUS | Status: DC

## 2021-07-31 MED ORDER — POTASSIUM CHLORIDE 10 MEQ/100ML IV SOLN
10.0000 meq | Freq: Once | INTRAVENOUS | Status: DC
  Filled 2021-07-31: qty 100

## 2021-07-31 MED ORDER — NITROGLYCERIN 0.4 MG SL SUBL
0.4000 mg | SUBLINGUAL_TABLET | SUBLINGUAL | Status: AC | PRN
  Administered 2021-07-31 (×3): 0.4 mg via SUBLINGUAL
  Filled 2021-07-31 (×2): qty 1

## 2021-07-31 SURGICAL SUPPLY — 8 items
CATH INFINITI 5FR MULTPACK ANG (CATHETERS) ×1 IMPLANT
CLOSURE MYNX CONTROL 5F (Vascular Products) ×1 IMPLANT
KIT HEART LEFT (KITS) ×2 IMPLANT
PACK CARDIAC CATHETERIZATION (CUSTOM PROCEDURE TRAY) ×2 IMPLANT
SHEATH PINNACLE 5F 10CM (SHEATH) ×1 IMPLANT
TRANSDUCER W/STOPCOCK (MISCELLANEOUS) ×2 IMPLANT
TUBING CIL FLEX 10 FLL-RA (TUBING) ×2 IMPLANT
WIRE EMERALD 3MM-J .035X150CM (WIRE) ×1 IMPLANT

## 2021-07-31 NOTE — ED Provider Notes (Signed)
?[place] EMERGENCY DEPARTMENT ?Provider Note ? ? ?CSN: [id number] ?Arrival date & time: 07/31/21  0341 ? ?  ? ?History ? ?Chief Complaint  ?Patient presents with  ? Chest Pain  ? ?Level 5 caveat due to acuity of condition ?Linda Pruitt is a 61 y.o. female. ? ? ?Chest Pain ?Associated symptoms: shortness of breath   ?Associated symptoms: no diaphoresis, no fever and no vomiting   ?Patient presents with chest pain has been ongoing for over 9 hours.  She reports she is feeling pressure in her chest.  She reports it does hurt to breathe.  She reports shortness of breath.  No fevers or vomiting.  No diaphoresis.  She has tried nitroglycerin with minimal relief. ?She reports recently being seen by her cardiologist at [facility] and had a normal echocardiogram.  She reports previous history of bypass surgery.  She is a non-smoker.  She reports medication compliance.  She takes aspirin and antihypertensives, no Plavix or anticoagulation ?  ? ?Home Medications ?Prior to Admission medications   ?Medication Sig Start Date End Date Taking? Authorizing Provider  ?lisinopril (PRINIVIL,ZESTRIL) 20 MG tablet Take 20 mg by mouth daily.    [provider]  ?   ? ?Allergies    ?Vicodin [hydrocodone-acetaminophen]   ? ?Review of Systems   ?Review of Systems  ?Constitutional:  Negative for diaphoresis and fever.  ?Respiratory:  Positive for shortness of breath.   ?Cardiovascular:  Positive for chest pain.  ?Gastrointestinal:  Negative for vomiting.  ? ?Physical Exam ?Updated Vital Signs ?BP (!) 145/90   Pulse 62   Temp 98.2 ?F (36.8 ?C) (Oral)   Resp 17   Ht 1.651 m (5\' 5" )   Wt 90.7 kg   LMP 04/23/2013   SpO2 97%   BMI 33.28 kg/m?  ?Physical Exam ?CONSTITUTIONAL: Well developed/well nourished ?HEAD: Normocephalic/atraumatic ?EYES: EOMI/PERRL ?ENMT: Mucous membranes moist ?NECK: supple no meningeal signs ?SPINE/BACK:entire spine nontender ?CV: S1/S2 noted, no murmurs/rubs/gallops noted ?LUNGS: Decreased breath  sounds in the right, but no acute distress ?ABDOMEN: soft, nontender ?NEURO: Pt is awake/alert/appropriate, moves all extremitiesx4.  No facial droop.   ?EXTREMITIES: pulses normal/equalx4, full ROM ?SKIN: warm, color normal ?PSYCH: no abnormalities of mood noted, alert and oriented to situation ? ?ED Results / Procedures / Treatments   ?Labs ?(all labs ordered are listed, but only abnormal results are displayed) ?Labs Reviewed  ?BASIC METABOLIC PANEL - Abnormal; Notable for the following components:  ?    Result Value  ? Potassium 2.6 (*)   ? CO2 34 (*)   ? Glucose, Bld 122 (*)   ? All other components within normal limits  ?CBG MONITORING, ED - Abnormal; Notable for the following components:  ? Glucose-Capillary 114 (*)   ? All other components within normal limits  ?TROPONIN I (HIGH SENSITIVITY) - Abnormal; Notable for the following components:  ? Troponin I (High Sensitivity) 231 (*)   ? All other components within normal limits  ?CBC  ?MAGNESIUM  ?APTT  ?PROTIME-INR  ?TROPONIN I (HIGH SENSITIVITY)  ? ? ?EKG ?EKG Interpretation ? ?Date/Time:  Monday Jul 31 2021 05:01:33 EDT ?Ventricular Rate:  76 ?PR Interval:  194 ?QRS Duration: 98 ?QT Interval:  399 ?QTC Calculation: 449 ?R Axis:   -13 ?Text Interpretation: Sinus rhythm Abnormal R-wave progression, late transition LVH with secondary repolarization abnormality Abnormal ekg Confirmed by Ripley Fraise [phone]) on 07/31/2021 5:04:29 AM ? ?Radiology ?DG Chest Portable 1 View ? ?Result Date: 07/31/2021 ?CLINICAL DATA:  Chest pain  that started at 7 or 8 o'clock EXAM: PORTABLE CHEST 1 VIEW COMPARISON:  12/08/2020 FINDINGS: Artifact from EKG leads. Cardiomegaly and aortic tortuosity. Prior median sternotomy. Fine interstitial coarsening which is chronic based on prior. Mild linear scarring at the lingula. There is no edema, consolidation, effusion, or pneumothorax. IMPRESSION: Stable exam.  No acute finding. Electronically Signed   By: Jorje Guild M.D.   On:  07/31/2021 04:22   ? ?Procedures ?Marland KitchenCritical Care ?Performed by: Ripley Fraise, MD ?Authorized by: Ripley Fraise, MD  ? ?Critical care provider statement:  ?  Critical care time (minutes):  60 ?  Critical care start time:  07/31/2021 4:45 AM ?  Critical care end time:  07/31/2021 5:45 AM ?  Critical care time was exclusive of:  Separately billable procedures and treating other patients ?  Critical care was necessary to treat or prevent imminent or life-threatening deterioration of the following conditions:  Circulatory failure and cardiac failure ?  Critical care was time spent personally by me on the following activities:  Discussions with consultants, development of treatment plan with patient or surrogate, pulse oximetry, ordering and review of radiographic studies, ordering and review of laboratory studies, ordering and performing treatments and interventions, review of old charts, evaluation of patient's response to treatment, examination of patient and obtaining history from patient or surrogate ?  I assumed direction of critical care for this patient from another provider in my specialty: no   ?  Care discussed with: admitting provider    ? ? ?Medications Ordered in ED ?Medications  ?nitroGLYCERIN (NITROSTAT) SL tablet 0.4 mg (0.4 mg Sublingual Given 07/31/21 0435)  ?potassium chloride 10 mEq in 100 mL IVPB (10 mEq Intravenous New Bag/Given 07/31/21 0506)  ?aspirin chewable tablet 324 mg (324 mg Oral Given 07/31/21 0405)  ?potassium chloride SA (KLOR-CON M) CR tablet 40 mEq (40 mEq Oral Given 07/31/21 0511)  ? ? ?ED Course/ Medical Decision Making/ A&P ?Clinical Course as of 07/31/21 0558  ?Mon Jul 31, 2021  ?0407 Initial EKG at 3:51 AM showed elevation in the high lateral leads, though highly suspicious for lead reversal.  Tech then changed the leads back to normal alignment and repeat EKG at 3:56 AM revealed no high lateral STEMI and similar to prior EKG [DW]  ?0446 Potassium(!!): 2.6 ?Hypokalemia noted [DW]   ?0505 Troponin I (High Sensitivity)(!!): 231 ?Labs consistent with non-STEMI, will consult cardiology [DW]  ?0543 Discussed with Dr. Marcelle Smiling with cardiology.  We have discussed the labs, EKG and history and physical.  He has reviewed all the EKGs.  Patient will be admitted for a non-STEMI. [DW]  ?0555 Patient started to have increased chest pressure.  Will give another dose of nitro.  Due to non-STEMI, will start heparin.  She denies any recent blood loss that should be safe for anticoagulation [DW]  ?0556 We discussed need for admission for non-STEMI.  Patient initially requested to stay in [facility], but her cardiologist is not affiliated with [facility].  Patient has agreed to be transferred to [facility]. [DW]  ?  ?Clinical Course User Index ?[DW] Ripley Fraise, MD  ? ?                        ?Medical Decision Making ?Amount and/or Complexity of Data Reviewed ?Labs: ordered. Decision-making details documented in ED Course. ?Radiology: ordered. ?ECG/medicine tests: ordered. ? ?Risk ?OTC drugs. ?Prescription drug management. ?Decision regarding hospitalization. ? ? ?This patient presents to the  ED for concern of chest pain, this involves an extensive number of treatment options, and is a complaint that carries with it a high risk of complications and morbidity.  The differential diagnosis includes but is not limited to acute coronary syndrome, pulmonary embolism, aortic dissection, pneumonia, pericarditis ? ?Comorbidities that complicate the patient evaluation: ?Patient?s presentation is complicated by their history of CAD ? ?S ?Additional history obtained: ?Additional history obtained from family ?Records reviewed Care Everywhere/External Records ? ?Lab Tests: ?I Ordered, and personally interpreted labs.  The pertinent results include: Hypokalemia, non-STEMI noted ? ?Imaging Studies ordered: ?I ordered imaging studies including X-ray chest   ?I independently visualized and interpreted imaging which  showed no acute finding ?I agree with the radiologist interpretation ? ?Cardiac Monitoring: ?The patient was maintained on a cardiac monitor.  I personally viewed and interpreted the cardiac monitor which showed an unde

## 2021-07-31 NOTE — ED Provider Notes (Signed)
Pt has had 3 NTG SL here but still with pain 4/10 ?Will start NTG drip and may need higher level of care ?  ?Ripley Fraise, MD ?07/31/21 [phone] ? ?

## 2021-07-31 NOTE — ED Triage Notes (Signed)
Complaining of chest pain that started 7 or 8 oclock last night. Sitting on the couch, feels like she cant take a breath, pressure in center of chest ?

## 2021-07-31 NOTE — ED Notes (Signed)
ECHO at bedside. RN got pt to sign procedural consent form. ?

## 2021-07-31 NOTE — Progress Notes (Signed)
?  Echocardiogram ?2D Echocardiogram has been performed. ? ?Janalyn Harder ?07/31/2021, 3:25 PM ?

## 2021-07-31 NOTE — ED Notes (Signed)
Report called to [facility] ED ?

## 2021-07-31 NOTE — ED Triage Notes (Signed)
Pt transferred [facility] from [facility]. Prt has been having cp since 7pm last night. Pt is on nirto gtt. Pt is AXOX4. vss ?

## 2021-07-31 NOTE — Interval H&P Note (Signed)
Cath Lab Visit (complete for each Cath Lab visit) ? ?Clinical Evaluation Leading to the Procedure:  ? ?ACS: Yes.   ? ?Non-ACS:   ? ?Anginal Classification: CCS II ? ?Anti-ischemic medical therapy: Maximal Therapy (2 or more classes of medications) ? ?Non-Invasive Test Results: No non-invasive testing performed ? ?Prior CABG: Previous CABG ? ? ? ? ? ?History and Physical Interval Note: ? ?07/31/2021 ?3:19 PM ? ?Linda Pruitt  has presented today for surgery, with the diagnosis of nstemi.  The various methods of treatment have been discussed with the patient and family. After consideration of risks, benefits and other options for treatment, the patient has consented to  Procedure(s): ?LEFT HEART CATH AND CORS/GRAFTS ANGIOGRAPHY (N/A) as a surgical intervention.  The patient's history has been reviewed, patient examined, no change in status, stable for surgery.  I have reviewed the patient's chart and labs.  Questions were answered to the patient's satisfaction.   ? ? ?Nanetta Batty ? ? ?

## 2021-07-31 NOTE — ED Notes (Signed)
Report given to [facility] 

## 2021-07-31 NOTE — Progress Notes (Addendum)
Pt called nurse to room stated having chest pain 4/10 sharp and heavy mid chest. EKG performed on pt and pt given NTG 0.4 mg sublingual x2. VS are stable pt stated after treatment pain now 1/10 subsiding. Will continue to monitor closely for further issues. Paged Dr. Joyce Gross with cardiology awaiting call back from MD.  ? ?2235- Received call back from Dr. Joyce Gross to continue monitoring pt for any more complaints of chest pain and to call back if needed. Pt stated pain is still 1/10 at this time.  ?

## 2021-07-31 NOTE — Progress Notes (Signed)
ANTICOAGULATION CONSULT NOTE - Initial Consult ? ?Pharmacy Consult for heparin ?Indication: chest pain/ACS ? ?Allergies  ?Allergen Reactions  ? Vicodin [Hydrocodone-Acetaminophen] Rash  ? ? ?Patient Measurements: ?Height: 5\' 5"  (165.1 cm) ?Weight: 90.7 kg (200 lb) ?IBW/kg (Calculated) : 57 ?Heparin Dosing Weight: 80kg ? ?Vital Signs: ?Temp: 98.2 ?F (36.8 ?C) (05/01 0354) ?Temp Source: Oral (05/01 0354) ?BP: 145/90 (05/01 0548) ?Pulse Rate: 62 (05/01 0548) ? ?Labs: ?Recent Labs  ?  07/31/21 ?0359  ?HGB 12.8  ?HCT 39.6  ?PLT 245  ?CREATININE 0.86  ?TROPONINIHS 231*  ? ? ?Estimated Creatinine Clearance: 77.4 mL/min (by C-G formula based on SCr of 0.86 mg/dL). ? ? ?Medical History: ?Past Medical History:  ?Diagnosis Date  ? Coronary artery disease   ? Hx of CABG   ? Hypertension   ? Stroke [facility])   ? ? ?Assessment: ?61yo female c/o CP/pressure since the evening, initial troponin elevated >> to begin heparin. ? ?Goal of Therapy:  ?Heparin level 0.3-0.7 units/ml ?Monitor platelets by anticoagulation protocol: Yes ?  ?Plan:  ?Heparin 4000 units IV bolus x1 followed by infusion at 1100 units/hr and monitor heparin levels and CBC. ? ?61yo, PharmD, BCPS  ?07/31/2021,6:18 AM ? ? ?

## 2021-07-31 NOTE — H&P (Addendum)
?Cardiology Admission History and Physical:  ? ?Patient ID: [name] ?MRN: [id number]; DOB: [birth year]  ? ?Admission date: 07/31/2021 ? ?PCP:  Governor Specking, MD ?  ?[facility] Providers ?Cardiologist:  [facility] ([facility]) Currently with [place] ? ?Chief Complaint:  Chest pain ? ?Patient Profile:  ? ?Linda Pruitt is a 61 y.o. female with NSTEMI withCAD s/p 4v CABG (LIMA-LAD, SVG-OM, SVG-RCA, and SVG-RI) '17, HTN, HLD, Stroke,  carotid artery stenosis who is being seen 07/31/2021 for the evaluation of chest pain. ? ?History of Present Illness:  ? ?Linda Pruitt is a 61 yo female with PMH noted above. She has been followed mostly through [facility] cardiology with [place] previously. She presented with an NSTEMI back in 2017 and underwent cardiac cath with severe multivessel CAD. Underwent 4v CABG 07/2015 with Dr. Freda Munro. EF was reported normal just several months prior to surgery. Several months later in 12/2015 underwent lap cholecystectomy. Suffered CVA in 04/2017. Also been followed by [facility] for bilateral carotid artery stenosis. Most recent has been no show visits at [facility] cardiology.  He with patient she reports she has transferred her cardiology care to [facility] and has been followed by Dr. Claudie Leach.  States she had a stress test done within the past couple years, and most recently had an echocardiogram that was okay per her report. ? ?Presented to the [facility] ED with complaints of chest pain.  States she has had 2 weeks of intermittent chest pain.  She has used sublingual nitroglycerin off-and-on with improvement in her symptoms.  Episodes mostly calm with rest.  States that she thought this was related to GERD.  Last night she had a sudden onset of severe centralized chest pain with radiation into her shoulder.  Ultimately called her cousin who took her to [place] for further evaluation. ? ?In the ED her labs showed sodium 145, potassium 2.6, creatinine 0.86, high-sensitivity troponin  231>>210, WBC 5, hemoglobin 12.8.  Chest x-ray negative.  EKG showed sinus rhythm, 65 bpm, severe LVH, biphasic T waves in lateral leads.  Case was discussed with overnight cardiology fellow with recommendations to transfer to [facility] for further management.  She was started on IV heparin, and nitro. Currently pain free at the time of interview.  ? ?Past Medical History:  ?Diagnosis Date  ? Coronary artery disease   ? Hx of CABG   ? Hypertension   ? Stroke [facility])   ? ? ?Past Surgical History:  ?Procedure Laterality Date  ? CARDIAC SURGERY    ?  ? ?Medications Prior to Admission: ?Prior to Admission medications   ?Medication Sig Start Date End Date Taking? Authorizing Provider  ?lisinopril (PRINIVIL,ZESTRIL) 20 MG tablet Take 20 mg by mouth daily.    [provider]  ?  ? ?Allergies:    ?Allergies  ?Allergen Reactions  ? Hydromorphone Other (See Comments)  ?  Hallucinations in combination with Morphine ?Hallucinations in combination with Morphine ?  ? Morphine Other (See Comments)  ?  Hallucinations in combination with Dilaudid ?Hallucinations in combination with Dilaudid ?  ? Iodinated Contrast Media Other (See Comments)  ?  Unknown reaction per pt ?Unknown reaction per pt ?  ? Latex Other (See Comments)  ?  Unknown reaction  ? Mixed Ragweed Other (See Comments)  ?  sneezing ?sneezing ?  ? Tape Other (See Comments)  ?  Pulled skin off  ? Vicodin [Hydrocodone-Acetaminophen] Nausea And Vomiting and Rash  ? ? ?Social History:   ?Social History  ? ?  Socioeconomic History  ? Marital status: Single  ?  Spouse name: Not on file  ? Number of children: Not on file  ? Years of education: Not on file  ? Highest education level: Not on file  ?Occupational History  ? Not on file  ?Tobacco Use  ? Smoking status: Former  ?  Types: Cigarettes  ? Smokeless tobacco: Never  ?Vaping Use  ? Vaping Use: Never used  ?Substance and Sexual Activity  ? Alcohol use: No  ? Drug use: Never  ? Sexual activity: Not on file  ?Other Topics  Concern  ? Not on file  ?Social History Narrative  ? Not on file  ? ?Social Determinants of Health  ? ?Financial Resource Strain: Not on file  ?Food Insecurity: Not on file  ?Transportation Needs: Not on file  ?Physical Activity: Not on file  ?Stress: Not on file  ?Social Connections: Not on file  ?Intimate Partner Violence: Not on file  ?  ?Family History:   ?The patient's family history is not on file.   ? ?ROS:  ?Please see the history of present illness.  ?All other ROS reviewed and negative.    ? ?Physical Exam/Data:  ? ?Vitals:  ? 07/31/21 1103 07/31/21 1113 07/31/21 1115 07/31/21 1118  ?BP: 127/89 136/77 140/84 (!) 149/87  ?Pulse: (!) 55 71 (!) 54 (!) 56  ?Resp: 11 19 17 18   ?Temp:      ?TempSrc:      ?SpO2: 100% 99% 100% 100%  ?Weight:      ?Height:      ? ? ?Intake/Output Summary (Last 24 hours) at 07/31/2021 1253 ?Last data filed at 07/31/2021 1120 ?Gross per 24 hour  ?Intake 125.54 ml  ?Output --  ?Net 125.54 ml  ? ? ?  07/31/2021  ?  3:56 AM 12/08/2020  ?  8:00 PM 12/21/2015  ?  6:51 AM  ?Last 3 Weights  ?Weight (lbs) 200 lb 200 lb 183 lb  ?Weight (kg) 90.719 kg 90.719 kg 83.008 kg  ?   ?Body mass index is 33.28 kg/m?.  ?General:  Well nourished, well developed, in no acute distress ?HEENT: normal ?Neck: no JVD ?Vascular: + bilateral carotid bruits; Distal pulses 2+ bilaterally   ?Cardiac:  normal S1, S2; RRR; 2/6 systolic murmur at RUSB  ?Lungs:  clear to auscultation bilaterally, no wheezing, rhonchi or rales  ?Abd: soft, nontender, no hepatomegaly  ?Ext: no edema ?Musculoskeletal:  No deformities, BUE and BLE strength normal and equal ?Skin: warm and dry  ?Neuro:  CNs 2-12 intact, no focal abnormalities noted ?Psych:  Normal affect  ? ? ?EKG:  The ECG that was done 07/31/21 was personally reviewed and demonstrates sinus rhythm, 65 bpm, severe LVH, biphasic T waves in lateral leads ? ?Relevant CV Studies: ? ?Cath: 07/2015 ? ?LMCA: Normal appearance with 0% stenosis.  ?LAD:  ?  Lesion on Prox LAD: 75% stenosis 16  mm length .  ?  Lesion on 1st Diag: 70% stenosis 12 mm length .  ?LCx:  ?  Lesion on Prox CX: 70% stenosis 17 mm length .  ?RCA:  ?  Lesion on Mid RCA: 90% stenosis 19 mm length . The lesion showed severe  ?  angulation and severe tortuosity.  ?  Lesion on Dist RCA: 99% stenosis 10 mm length . Pre procedure TIMI I flow  ?  was noted.  ?  Lesion on R PAV: 99% stenosis 9 mm length . Pre procedure TIMI I flow was  ?  noted.  ?Ramus:  ?  Lesion on Ramus: 65% stenosis 9 mm length .  ?Cardiac Collaterals  ?  - Moderatecollateral flow from the 2nd Ob Marg to the R PAV.  ? ?Multivessel CAD.  ?Severe stenosis of the LAD, Diagonal Branch, Circumflex, RCA  ?Normal LV function  ?Diagnostic Procedure Recommendations  ?CT surgery consultation for Possible CABG .  ? ?Echo: 04/2015 ? ?Findings  ?Mitral Valve  ?Structurally normal mitral valve.  ?Mild mitral regurgitation by color flow doppler examination.  ?Aortic Valve  ?The aortic valve appears to be trileaflet.  ?Tricuspid Valve  ?Tricuspid valve is structurally normal.  ?Mild tricuspid regurgitation by color flow doppler examination.  ?Pulmonic Valve  ?The pulmonic valve was not well visualized  ?No Doppler evidence of pulmonic stenosis or insufficiency.  ?Left Atrium  ?Normal size left atrium.  ?Left Ventricle  ?Ejection fraction is visually estimated at 65-70%  ?Moderate concentric left ventricular hypertrophy  ?Right Atrium  ?Normal right atrium.  ?Right Ventricle  ?The right not well visualized in standard views but likely normal function  ?Pericardial Effusion  ?No evidence of pericardial effusion.  ?Miscellaneous  ?The IVC is normal  ? ? ?Laboratory Data: ? ?High Sensitivity Troponin:   ?Recent Labs  ?Lab 07/31/21 ?[id number] 07/31/21 ?0616  ?TROPONINIHS 231* 210*  ?    ?Chemistry ?Recent Labs  ?Lab 07/31/21 ?0359 07/31/21 ?0446  ?NA 145  --   ?K 2.6*  --   ?CL 102  --   ?CO2 34*  --   ?GLUCOSE 122*  --   ?BUN 11  --   ?CREATININE 0.86  --   ?CALCIUM 10.1  --   ?MG  --  1.7   ?GFRNONAA >60  --   ?ANIONGAP 9  --   ?  ?No results for input(s): PROT, ALBUMIN, AST, ALT, ALKPHOS, BILITOT in the last 168 hours. ?Lipids No results for input(s): CHOL, TRIG, HDL, LABVLDL, LDLCALC, CHOLHDL in t

## 2021-07-31 NOTE — ED Notes (Signed)
Pt reports her CP is worse with movement ?

## 2021-07-31 NOTE — ED Notes (Signed)
9 beat run Vtach noted on monitor then back to Sinus brady at this time Pt Dr Jacqulyn Bath notified. ED to ED transfer with Dr Erin Hearing accepting. Pt resting with complaint of continued chest pressure. Increased nitro 10 mcg and placed on 2  L via [place] ?

## 2021-07-31 NOTE — ED Notes (Signed)
[facility] arrived to transport ?

## 2021-07-31 NOTE — Progress Notes (Signed)
ANTICOAGULATION CONSULT NOTE ? ?Pharmacy Consult for heparin ?Indication: chest pain/ACS ? ?Allergies  ?Allergen Reactions  ? Hydromorphone Other (See Comments)  ?  Hallucinations in combination with Morphine ?Hallucinations in combination with Morphine ?  ? Morphine Other (See Comments)  ?  Hallucinations in combination with Dilaudid ?Hallucinations in combination with Dilaudid ?  ? Iodinated Contrast Media Other (See Comments)  ?  Unknown reaction per pt ? ?  ? Latex Other (See Comments)  ?  Unknown reaction  ? Mixed Ragweed Other (See Comments)  ?  sneezing ?sneezing ?  ? Tape Other (See Comments)  ?  Pulled skin off  ? Vicodin [Hydrocodone-Acetaminophen] Nausea And Vomiting and Rash  ? ? ?Patient Measurements: ?Height: 5\' 5"  (165.1 cm) ?Weight: 90.7 kg (200 lb) ?IBW/kg (Calculated) : 57 ?Heparin Dosing Weight: 80kg ? ?Vital Signs: ?Temp: 98.1 ?F (36.7 ?C) (05/01 1020) ?Temp Source: Oral (05/01 1020) ?BP: 148/83 (05/01 1710) ?Pulse Rate: 65 (05/01 1710) ? ?Labs: ?Recent Labs  ?  07/31/21 ?0359 07/31/21 ?0616 07/31/21 ?1549  ?HGB 12.8  --  11.6*  ?HCT 39.6  --  34.0*  ?PLT 245  --   --   ?APTT  --  32  --   ?LABPROT  --  13.0  --   ?INR  --  1.0  --   ?CREATININE 0.86  --   --   ?TROPONINIHS 231* 210*  --   ? ? ? ?Estimated Creatinine Clearance: 77.4 mL/min (by C-G formula based on SCr of 0.86 mg/dL). ? ? ?Medical History: ?Past Medical History:  ?Diagnosis Date  ? Coronary artery disease   ? Hx of CABG   ? Hypertension   ? Stroke [facility])   ? ? ?Assessment: ?61yo female with PMH of CAD s/p 4v CABG, HTN, HLD, Stroke, and carotid artery stenosis  presenting with CP and initiated on heparin drip, now s/p cath 5/1 showing occluded native vessels and patent grafts to OM branch, ramus branch, distal right coronary artery and LIMA to LAD with no "culprit lesions" responsible for NSTEMI. Medical therapy recommended.  ? ?Pharmacy consulted to resume heparin 4 hrs post-sheath removal. Sheath removed 1612 per cath procedure  log. Patient is not on anticoagulation PTA. ? ?No previous heparin levels drawn prior to cath. Hg down to 11.6 today, plt wnl. No bleed issues reported. ? ?Goal of Therapy:  ?Heparin level 0.3-0.7 units/ml ?Monitor platelets by anticoagulation protocol: Yes ?  ?Plan:  ?No bolus with recent cath. Resume heparin at previous rate 1100 units/hr at 2015 (4hrs post-sheath removal) ?Check 6hr heparin level ?Monitor daily CBC, s/sx bleeding ? ? ?Arturo Morton, PharmD, BCPS ?Please check AMION for all [place] contact numbers ?Clinical Pharmacist ?07/31/2021 5:24 PM ?

## 2021-08-01 ENCOUNTER — Observation Stay (HOSPITAL_COMMUNITY): Payer: Medicaid Other

## 2021-08-01 ENCOUNTER — Encounter (HOSPITAL_COMMUNITY): Payer: Self-pay | Admitting: Cardiovascular Disease

## 2021-08-01 DIAGNOSIS — Z951 Presence of aortocoronary bypass graft: Secondary | ICD-10-CM

## 2021-08-01 DIAGNOSIS — Z91048 Other nonmedicinal substance allergy status: Secondary | ICD-10-CM | POA: Diagnosis not present

## 2021-08-01 DIAGNOSIS — I6930 Unspecified sequelae of cerebral infarction: Secondary | ICD-10-CM | POA: Diagnosis not present

## 2021-08-01 DIAGNOSIS — I10 Essential (primary) hypertension: Secondary | ICD-10-CM

## 2021-08-01 DIAGNOSIS — Z91040 Latex allergy status: Secondary | ICD-10-CM | POA: Diagnosis not present

## 2021-08-01 DIAGNOSIS — Z87891 Personal history of nicotine dependence: Secondary | ICD-10-CM | POA: Diagnosis not present

## 2021-08-01 DIAGNOSIS — Z885 Allergy status to narcotic agent status: Secondary | ICD-10-CM | POA: Diagnosis not present

## 2021-08-01 DIAGNOSIS — I252 Old myocardial infarction: Secondary | ICD-10-CM | POA: Diagnosis not present

## 2021-08-01 DIAGNOSIS — E785 Hyperlipidemia, unspecified: Secondary | ICD-10-CM | POA: Diagnosis present

## 2021-08-01 DIAGNOSIS — Z79899 Other long term (current) drug therapy: Secondary | ICD-10-CM | POA: Diagnosis not present

## 2021-08-01 DIAGNOSIS — I472 Ventricular tachycardia, unspecified: Secondary | ICD-10-CM | POA: Diagnosis present

## 2021-08-01 DIAGNOSIS — Z7982 Long term (current) use of aspirin: Secondary | ICD-10-CM | POA: Diagnosis not present

## 2021-08-01 DIAGNOSIS — I517 Cardiomegaly: Secondary | ICD-10-CM

## 2021-08-01 DIAGNOSIS — I4729 Other ventricular tachycardia: Secondary | ICD-10-CM

## 2021-08-01 DIAGNOSIS — I25110 Atherosclerotic heart disease of native coronary artery with unstable angina pectoris: Secondary | ICD-10-CM | POA: Diagnosis present

## 2021-08-01 DIAGNOSIS — E876 Hypokalemia: Secondary | ICD-10-CM | POA: Diagnosis not present

## 2021-08-01 DIAGNOSIS — Z91041 Radiographic dye allergy status: Secondary | ICD-10-CM | POA: Diagnosis not present

## 2021-08-01 DIAGNOSIS — I25119 Atherosclerotic heart disease of native coronary artery with unspecified angina pectoris: Secondary | ICD-10-CM | POA: Diagnosis not present

## 2021-08-01 DIAGNOSIS — R079 Chest pain, unspecified: Secondary | ICD-10-CM | POA: Diagnosis present

## 2021-08-01 DIAGNOSIS — I214 Non-ST elevation (NSTEMI) myocardial infarction: Secondary | ICD-10-CM | POA: Diagnosis present

## 2021-08-01 LAB — BASIC METABOLIC PANEL
Anion gap: 14 (ref 5–15)
BUN: 12 mg/dL (ref 6–20)
CO2: 23 mmol/L (ref 22–32)
Calcium: 9.2 mg/dL (ref 8.9–10.3)
Chloride: 102 mmol/L (ref 98–111)
Creatinine, Ser: 1.01 mg/dL — ABNORMAL HIGH (ref 0.44–1.00)
GFR, Estimated: >60 mL/min (ref >60)
Glucose, Bld: 183 mg/dL — ABNORMAL HIGH (ref 70–99)
Potassium: 3.4 mmol/L — ABNORMAL LOW (ref 3.5–5.1)
Sodium: 139 mmol/L (ref 135–145)

## 2021-08-01 LAB — CBC
HCT: 38.6 % (ref 36.0–46.0)
Hemoglobin: 12.4 g/dL (ref 12.0–15.0)
MCH: 27.6 pg (ref 26.0–34.0)
MCHC: 32.1 g/dL (ref 30.0–36.0)
MCV: 86 fL (ref 80.0–100.0)
Platelets: 240 10*3/uL (ref 150–400)
RBC: 4.49 MIL/uL (ref 3.87–5.11)
RDW: 13.9 % (ref 11.5–15.5)
WBC: 10.5 10*3/uL (ref 4.0–10.5)
nRBC: 0 % (ref 0.0–0.2)

## 2021-08-01 LAB — LIPID PANEL
Cholesterol: 206 mg/dL — ABNORMAL HIGH (ref 0–200)
HDL: 57 mg/dL (ref >40)
LDL Cholesterol: 123 mg/dL — ABNORMAL HIGH (ref 0–99)
Total CHOL/HDL Ratio: 3.6 RATIO
Triglycerides: 128 mg/dL (ref <150)
VLDL: 26 mg/dL (ref 0–40)

## 2021-08-01 LAB — TROPONIN I (HIGH SENSITIVITY): Troponin I (High Sensitivity): 210 ng/L (ref <18)

## 2021-08-01 LAB — HEPARIN LEVEL (UNFRACTIONATED)
Heparin Unfractionated: 0.2 IU/mL — ABNORMAL LOW (ref 0.30–0.70)
Heparin Unfractionated: 0.39 IU/mL (ref 0.30–0.70)

## 2021-08-01 LAB — HIV ANTIBODY (ROUTINE TESTING W REFLEX): HIV Screen 4th Generation wRfx: NONREACTIVE

## 2021-08-01 MED ORDER — CARVEDILOL 25 MG PO TABS
25.0000 mg | ORAL_TABLET | Freq: Two times a day (BID) | ORAL | Status: DC
Start: 2021-08-01 — End: 2021-08-02
  Administered 2021-08-01 – 2021-08-02 (×2): 25 mg via ORAL
  Filled 2021-08-01 (×2): qty 1

## 2021-08-01 MED ORDER — ISOSORBIDE MONONITRATE ER 30 MG PO TB24
30.0000 mg | ORAL_TABLET | Freq: Every day | ORAL | Status: DC
  Administered 2021-08-02 (×2): 30 mg via ORAL
  Filled 2021-08-01 (×2): qty 1

## 2021-08-01 MED ORDER — LABETALOL HCL 5 MG/ML IV SOLN: 10.0000 mg | INTRAVENOUS | Status: AC | PRN

## 2021-08-01 MED ORDER — LABETALOL HCL 5 MG/ML IV SOLN: 10.0000 mg | INTRAVENOUS | Status: DC | PRN

## 2021-08-01 MED ORDER — LISINOPRIL 20 MG PO TABS
20.0000 mg | ORAL_TABLET | Freq: Every day | ORAL | Status: DC
Start: 2021-08-01 — End: 2021-08-02
  Administered 2021-08-01 – 2021-08-02 (×2): 20 mg via ORAL
  Filled 2021-08-01 (×2): qty 1

## 2021-08-01 MED ORDER — CLOPIDOGREL BISULFATE 300 MG PO TABS
300.0000 mg | ORAL_TABLET | Freq: Once | ORAL | Status: AC
  Administered 2021-08-02: 300 mg via ORAL
  Filled 2021-08-01: qty 1

## 2021-08-01 MED ORDER — CLOPIDOGREL BISULFATE 75 MG PO TABS
75.0000 mg | ORAL_TABLET | Freq: Every day | ORAL | Status: DC
Start: 2021-08-03 — End: 2021-08-02

## 2021-08-01 MED ORDER — HYDRALAZINE HCL 20 MG/ML IJ SOLN
10.0000 mg | INTRAMUSCULAR | Status: DC | PRN
  Administered 2021-08-01: 10 mg via INTRAVENOUS
  Filled 2021-08-01: qty 1

## 2021-08-01 NOTE — Assessment & Plan Note (Signed)
Severe occlusive negative CAD with widely patent 4 out of 4 grafts. ?No obvious culprit lesion to treat, however there is plenty of places that could have been occluded since we do not have idea of the anatomy before. ? ?Thankfully, echo showed normal EF and no significant wall motion abnormalities. ? ?At home she is on aspirin, statin, max dose amlodipine and carvedilol along with moderate dose lisinopril. ?Also on atorvastatin 40 mg daily. ? ?Plan is to complete 48 hours of IV heparin for elevated troponin/non-STEMI ?With no culprit lesion, we will simply titrate up antihypertensives and antianginal medications, consider Imdur if she has any more chest pain. ?

## 2021-08-01 NOTE — Assessment & Plan Note (Signed)
Presented with atypical chest pain symptoms but troponin elevation.  Clinically consistent with STEMI, however cardiac catheterization did not reveal any potential culprit lesions.  Could potentially have been microvascular disease or a small branch not seen since we do not know her baseline anatomy. ? ?Plan will be to complete 48 hours of IV heparin overnight tonight, start Plavix to continue with aspirin for 6 months. ? ?Continue beta-blocker, statin and calcium channel blocker.  Restart ACE inhibitor. ? ?PRN for antihypertensives. ?

## 2021-08-01 NOTE — Progress Notes (Signed)
ANTICOAGULATION CONSULT NOTE ? ?Pharmacy Consult for heparin ?Indication: chest pain/ACS ? ?Allergies  ?Allergen Reactions  ? Hydromorphone Other (See Comments)  ?  Hallucinations in combination with Morphine ?Hallucinations in combination with Morphine ?  ? Morphine Other (See Comments)  ?  Hallucinations in combination with Dilaudid ?Hallucinations in combination with Dilaudid ?  ? Iodinated Contrast Media Other (See Comments)  ?  Unknown reaction per pt ? ?  ? Latex Other (See Comments)  ?  Unknown reaction  ? Mixed Ragweed Other (See Comments)  ?  sneezing ?sneezing ?  ? Tape Other (See Comments)  ?  Pulled skin off  ? Vicodin [Hydrocodone-Acetaminophen] Nausea And Vomiting and Rash  ? ? ? ?Patient Measurements: ?Height: 5\' 5"  (165.1 cm) ?Weight: 90.7 kg (200 lb) ?IBW/kg (Calculated) : 57 ?Heparin Dosing Weight: 80kg ? ?Vital Signs: ?Temp: 98.3 ?F (36.8 ?C) (05/02 1210) ?Temp Source: Oral (05/02 1210) ?BP: 171/97 (05/02 1210) ?Pulse Rate: 87 (05/02 1210) ? ?Labs: ?Recent Labs  ?  07/31/21 ?0359 07/31/21 ?0616 07/31/21 ?1549 08/01/21 ?0255 08/01/21 ?1324  ?HGB 12.8  --  11.6* 12.4  --   ?HCT 39.6  --  34.0* 38.6  --   ?PLT 245  --   --  240  --   ?APTT  --  32  --   --   --   ?LABPROT  --  13.0  --   --   --   ?INR  --  1.0  --   --   --   ?HEPARINUNFRC  --   --   --  0.20* 0.39  ?CREATININE 0.86  --   --  1.01*  --   ?TROPONINIHS 231* 210*  --   --   --   ? ? ?Estimated Creatinine Clearance: 65.9 mL/min (A) (by C-G formula based on SCr of 1.01 mg/dL (H)). ? ? ?Medical History: ?Past Medical History:  ?Diagnosis Date  ? Coronary artery disease   ? Hx of CABG   ? Hypertension   ? Stroke [facility])   ? ? ?Assessment: ?61yo female s/p cath 5/1 found to have occluded native vessels and patent grafts to the OM branch, ramus branch, distal right coronary artery and LIMA to the LAD. Cardiology recommended medical therapy. Patient is not on anticoagulation PTA. Pharmacy consulted for heparin.   ? ?Heparin level 0.39 is  therapeutic on 1300 units/hr. Plan for 48 hours per cardiology.  ? ? ?Goal of Therapy:  ?Heparin level 0.3-0.7 units/ml ?Monitor platelets by anticoagulation protocol: Yes ?  ?Plan:   ?Continue heparin to 1300 units/hr  ?Check daily heparin level ?Monitor daily CBC, s/sx bleeding ?Stop in 48 hrs - 5/3 PM  ? ? ?Benetta Spar, PharmD, BCPS, BCCP ?Clinical Pharmacist ? ?Please check AMION for all [place] phone numbers ?After 10:00 PM, call [place] [phone] ? ?

## 2021-08-01 NOTE — Assessment & Plan Note (Signed)
Seen on echocardiogram.  Severe septal thickening.  Likely cause of SEM. ?No prior echo to review.  Question if this is simply hypertensive hypertrophy versus other type of myopathy.  Will need further evaluation in the outpatient setting. ?No RWMA.  We have indeterminate diastolic function.  I suspected to be the extent of LVH, but there is some diastolic dysfunction. ? ?Continue blood pressure control, defer further evaluation to outpatient cardiologist. ?

## 2021-08-01 NOTE — Assessment & Plan Note (Signed)
Lab Results  ?Component Value Date  ? CHOL 206 (H) 08/01/2021  ? HDL 57 08/01/2021  ? LDLCALC 123 (H) 08/01/2021  ? TRIG 128 08/01/2021  ? CHOLHDL 3.6 08/01/2021  ? ?LDL P40 controlled.  He is currently on atorvastatin 40 mg we will increase to 80 mg.  Will likely need outpatient lipid clinic management.  Currently receiving cardiology care at [facility].  Anticipate that she may benefit from PCSK9 inhibitor. ?

## 2021-08-01 NOTE — Plan of Care (Signed)
  Problem: Cardiovascular: Goal: Ability to achieve and maintain adequate cardiovascular perfusion will improve Outcome: Progressing Goal: Vascular access site(s) Level 0-1 will be maintained Outcome: Progressing   

## 2021-08-01 NOTE — Assessment & Plan Note (Signed)
Only residual deficit is memory issues. ?

## 2021-08-01 NOTE — Assessment & Plan Note (Signed)
Blood pressure significantly elevated here today.  Was not started back on all of her home medications.  We will restart her home dose of lisinopril and increase carvedilol to 25 mg twice daily from 12.5 mg twice daily and continue amlodipine. ?Have also written for IV hydralazine and labetalol for PRN elevated blood pressure.  Would preferentially use labetalol since she is having salvos of NSVT. ? ?

## 2021-08-01 NOTE — Progress Notes (Signed)
? ?Progress Note ? ?Patient Name: Linda Pruitt ?Date of Encounter: 08/01/2021 ? ?[place] Cardiologist: None Dr. Claudie Leach [facility] ? ?Patient Profile  ?   ?Linda Pruitt is a 61 y.o. female with NSTEMI withCAD s/p 4v CABG (LIMA-LAD, SVG-OM, SVG-RCA, and SVG-RI) '17, HTN, HLD, Stroke,  carotid artery stenosis who is being seen 07/31/2021 for the evaluation of chest pain with elevated troponin consistent with possible non-STEMI.Marland Kitchen ?Cardiac catheterization performed 07/31/2021 with patent grafts and no obvious culprit lesion.  Plan medical therapy. ? ?Assessment & Plan  ?  ?Principal Problem: ?  NSTEMI (non-ST elevated myocardial infarction) ([place]) ?Active Problems: ?  Non-STEMI (non-ST elevated myocardial infarction) ([place]) ?  Coronary artery disease involving native coronary artery of native heart with angina pectoris ([place]) ?  Nonsustained ventricular tachycardia (HCC) -> salvos of 4-7 beats. ?  Essential hypertension ?  Hyperlipidemia with target LDL less than 70 ?  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  Hx of CABG ?  Chest pain ?  Hypertrophic cardiomegaly ? ?Nonsustained ventricular tachycardia (HCC) -> salvos of 4-7 beats. ?Brief salvos of 4-7 beats noted on telemetry overnight.  Very well likely related to intercurrent ischemic event.  Had been on 25 mg twice daily of Coreg, 12.5 mg p.o. ordered here.  We will increase back to 25 mg twice daily and monitor closely. ? ?Echo showed normal EF, but with significant native CAD, there is clearly some ischemic component. ? ?Would like to see more stable blood pressure and less prominent salvos prior to discharge. ? ?Coronary artery disease involving native coronary artery of native heart with angina pectoris ([place]) ?Severe occlusive negative CAD with widely patent 4 out of 4 grafts. ?No obvious culprit lesion to treat, however there is plenty of places that could have been occluded since we do not have idea of the anatomy  before. ? ?Thankfully, echo showed normal EF and no significant wall motion abnormalities. ? ?At home she is on aspirin, statin, max dose amlodipine and carvedilol along with moderate dose lisinopril. ?Also on atorvastatin 40 mg daily. ? ?Plan is to complete 48 hours of IV heparin for elevated troponin/non-STEMI ?With no culprit lesion, we will simply titrate up antihypertensives and antianginal medications, consider Imdur if she has any more chest pain. ? ?Non-STEMI (non-ST elevated myocardial infarction) ([place]) ?Unusual presentation.  Her chest pain in part seem to be quite consistent with angina, however there was also reproducible chest wall pain.  Echo did not show any evidence of reduced EF to suggest myocarditis as etiology.  It is quite possible that she had small vessel ischemic from her native arteries which are diffusely diseased. ? ?Plan will be to complete 48 hours of IV heparin, continue aspirin but start Plavix for 6 months. ?Continue beta-blocker, statin, ACE inhibitor. ? ?Essential hypertension ?Blood pressure significantly elevated here today.  Was not started back on all of her home medications.  We will restart her home dose of lisinopril and increase carvedilol to 25 mg twice daily from 12.5 mg twice daily and continue amlodipine. ?Have also written for IV hydralazine and labetalol for PRN elevated blood pressure.  Would preferentially use labetalol since she is having salvos of NSVT. ? ? ?Hyperlipidemia with target LDL less than 70 ?Lab Results  ?Component Value Date  ? CHOL 206 (H) 08/01/2021  ? HDL 57 08/01/2021  ? LDLCALC 123 (H) 08/01/2021  ? TRIG 128 08/01/2021  ? CHOLHDL 3.6 08/01/2021  ? ?LDL P40 controlled.  He is currently on atorvastatin 40 mg we will increase to 80 mg.  Will likely need outpatient lipid clinic management.  Currently receiving cardiology care at [facility] that she may benefit from PCSK9 inhibitor. ? ?History of stroke with residual effects - mostly  memory issues ?Only residual deficit is memory issues. ? ?NSTEMI (non-ST elevated myocardial infarction) ([place]) ?Presented with atypical chest pain symptoms but troponin elevation.  Clinically consistent with STEMI, however cardiac catheterization did not reveal any potential culprit lesions.  Could potentially have been microvascular disease or a small branch not seen since we do not know her baseline anatomy. ? ?Plan will be to complete 48 hours of IV heparin overnight tonight, start Plavix to continue with aspirin for 6 months. ? ?Continue beta-blocker, statin and calcium channel blocker.  Restart ACE inhibitor. ? ?PRN for antihypertensives. ? ?Hypertrophic cardiomegaly ?Seen on echocardiogram.  Severe septal thickening.  Likely cause of SEM. ?No prior echo to review.  Question if this is simply hypertensive hypertrophy versus other type of myopathy.  Will need further evaluation in the outpatient setting. ?No RWMA.  We have indeterminate diastolic function.  I suspected to be the extent of LVH, but there is some diastolic dysfunction. ? ?Continue blood pressure control, defer further evaluation to outpatient cardiologist. ? ? ?ANTICIPATE D/C 08/02/2021 if chest pain-free with ambulation, and blood pressure stable ? ? ?Subjective  ? ?Feels well this morning.  No further chest pain.  Happy about the results of catheterization. ? ?Inpatient Medications  ?  ?Scheduled Meds: ? amLODipine  10 mg Oral Daily  ? aspirin EC  81 mg Oral Daily  ? atorvastatin  40 mg Oral Daily  ? carvedilol  25 mg Oral BID WC  ? [START ON 08/02/2021] clopidogrel  300 mg Oral Once  ? [START ON 08/03/2021] clopidogrel  75 mg Oral Daily  ? [START ON 08/02/2021] isosorbide mononitrate  30 mg Oral Daily  ? lisinopril  20 mg Oral Daily  ? sodium chloride flush  3 mL Intravenous Q12H  ? sodium chloride flush  3 mL Intravenous Q12H  ? ?Continuous Infusions: ? sodium chloride    ? heparin 1,300 Units/hr (08/01/21 1921)  ? nitroGLYCERIN Stopped (07/31/21  1745)  ? potassium chloride    ? ?PRN Meds: ?sodium chloride, acetaminophen, acetaminophen, hydrALAZINE, labetalol, labetalol, ondansetron (ZOFRAN) IV, sodium chloride flush  ? ?Vital Signs  ?  ?Vitals:  ? 08/01/21 1210 08/01/21 1649 08/01/21 2009 08/01/21 2155  ?BP: (!) 171/97 (!) 159/92 (!) 171/90 (!) 169/89  ?Pulse: 87 77 71   ?Resp: 15 18 19 17   ?Temp: 98.3 ?F (36.8 ?C) 97.6 ?F (36.4 ?C) 98.9 ?F (37.2 ?C)   ?TempSrc: Oral Oral Oral   ?SpO2: 100% 98% 95%   ?Weight:      ?Height:      ? ? ?Intake/Output Summary (Last 24 hours) at 08/01/2021 2312 ?Last data filed at 08/01/2021 0557 ?Gross per 24 hour  ?Intake 1458.46 ml  ?Output --  ?Net 1458.46 ml  ? ? ?  07/31/2021  ?  3:56 AM 12/08/2020  ?  8:00 PM 12/21/2015  ?  6:51 AM  ?Last 3 Weights  ?Weight (lbs) 200 lb 200 lb 183 lb  ?Weight (kg) 90.719 kg 90.719 kg 83.008 kg  ?   ? ?Telemetry  ?  ?Sinus rhythm with some sinus tachycardia and salvos of NSVT- Personally Reviewed ? ?ECG  ?  ?Sinus rhythm, LVH with repolarization changes.  LAD.  Biphasic ST and  T waves in precordial leads with T wave inversions and subtle depressions in V5 V6..- Personally Reviewed ? ?Physical Exam  ? ?GEN: Obese, otherwise well-nourished, well-groomed.  No acute distress.   ?Neck: No JVD, or bruit ?Cardiac: RRR, normal S1 and S2.  2/6 SEM RUSB.  Otherwise no R/G.  ?Respiratory: CTA B, nonlabored, good appetite. ?GI: Soft, nontender, non-distended  ?MS: No edema; No deformity. ?Neuro:  Nonfocal; CN II-XII grossly intact. ?Psych: Normal affect  ? ?Labs  ?  ?High Sensitivity Troponin:   ?Recent Labs  ?Lab 07/31/21 ?[id number] 07/31/21 ?0616  ?TROPONINIHS 231* 210*  ?   ?Chemistry ?Recent Labs  ?Lab 07/31/21 ?0359 07/31/21 ?0446 07/31/21 ?1549 08/01/21 ?0255  ?NA 145  --  142 139  ?K 2.6*  --  3.0* 3.4*  ?CL 102  --   --  102  ?CO2 34*  --   --  23  ?GLUCOSE 122*  --   --  183*  ?BUN 11  --   --  12  ?CREATININE 0.86  --   --  1.01*  ?CALCIUM 10.1  --   --  9.2  ?MG  --  1.7  --   --   ?GFRNONAA >60  --   --   >60  ?ANIONGAP 9  --   --  14  ?  ?Lipids  ?Recent Labs  ?Lab 08/01/21 ?0255  ?CHOL 206*  ?TRIG 128  ?HDL 57  ?[place] 123*  ?CHOLHDL 3.6  ?  ?Hematology ?Recent Labs  ?Lab 07/31/21 ?0359 07/31/21 ?1549 08/01/21 ?025

## 2021-08-01 NOTE — Discharge Summary (Addendum)
?Discharge Summary  ?  ?Patient ID: Linda Pruitt ?MRN: [id number]; DOB: 03/28/1961 ? ?Admit date: 07/31/2021 ?Discharge date: 08/02/2021 ? ?PCP:  Earnest Rosier, MD ?  ?[facility] Providers ?Cardiologist:  None   [facility], Dr. Hanley Hays ? ? ?Discharge Diagnoses  ?  ?Principal Problem: ?  NSTEMI (non-ST elevated myocardial infarction) (HCC) ?Active Problems: ?  Non-STEMI (non-ST elevated myocardial infarction) (HCC) ?  Coronary artery disease involving native coronary artery of native heart with angina pectoris (HCC) ?  Hx of CABG ?  Essential hypertension ?  Hyperlipidemia with target LDL less than 70 ?  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  Nonsustained ventricular tachycardia (HCC) -> salvos of 4-7 beats. ?  Chest pain ?  Hypertrophic cardiomegaly ? ? ?Diagnostic Studies/Procedures  ?  ?Cath: 07/31/21 ? ?  Mid RCA to Dist RCA lesion is 100% stenosed. ?  Prox RCA lesion is 75% stenosed. ?  Ost Cx to Prox Cx lesion is 100% stenosed. ?  Ost LAD to Prox LAD lesion is 100% stenosed. ?  ?IMPRESSION: Linda Pruitt has occluded native vessels and patent grafts to the OM branch, ramus branch, distal right coronary artery and LIMA to the LAD.  There are no "culprit lesions" responsible for her non-STEMI.  Medical therapy will be recommended.  The right femoral angiograms performed and Mynx closure device successfully deployed.  2D echo has been ordered.  I reviewed the films with Dr. Herbie Baltimore, her attending cardiologist who agrees with this assessment.  I will restart her on heparin 4 hours after sheath removal without a bolus.  She left the lab in stable condition. ?  ?Linda Pruitt. MD, [facility] ?07/31/2021 ?4:25 PM ? ?Diagnostic ?Dominance: Right ? ?Echo: 07/31/21 ? ?IMPRESSIONS  ? ? ? 1. Chordal systolic anterior motion of the mitral valve apparatus, but no  ?clear LVOT gradient captured. Left ventricular ejection fraction, by  ?estimation, is 60 to 65%. The left ventricle has normal function. The  left  ?ventricle has no regional wall  ?motion abnormalities. There is severe eccentric left ventricular  ?hypertrophy of the septal segment. Left ventricular diastolic parameters  ?are indeterminate.  ? 2. Right ventricular systolic function is normal. The right ventricular  ?size is normal. There is normal pulmonary artery systolic pressure.  ? 3. The mitral valve is abnormal. Trivial mitral valve regurgitation. No  ?evidence of mitral stenosis.  ? 4. The aortic valve is tricuspid. Aortic valve regurgitation is mild. No  ?aortic stenosis is present.  ? 5. The inferior vena cava is normal in size with greater than 50%  ?respiratory variability, suggesting right atrial pressure of 3 mmHg.  ? ?Comparison(s): No prior Echocardiogram.  ? ?Conclusion(s)/Recommendation(s): Otherwise normal echocardiogram, with  ?minor abnormalities described in the report.  ? ?FINDINGS  ? Left Ventricle: Chordal systolic anterior motion of the mitral valve  ?apparatus, but no clear LVOT gradient captured. Left ventricular ejection  ?fraction, by estimation, is 60 to 65%. The left ventricle has normal  ?function. The left ventricle has no  ?regional wall motion abnormalities. The left ventricular internal cavity  ?size was normal in size. There is severe eccentric left ventricular  ?hypertrophy of the septal segment. Left ventricular diastolic parameters  ?are indeterminate.  ? ?Right Ventricle: The right ventricular size is normal. No increase in  ?right ventricular wall thickness. Right ventricular systolic function is  ?normal. There is normal pulmonary artery systolic pressure. The tricuspid  ?regurgitant velocity is 2.20 m/s, and  ?  with an assumed right atrial pressure of 3 mmHg, the estimated right  ?ventricular systolic pressure is [id number] mmHg.  ? ?Left Atrium: Left atrial size was normal in size.  ? ?Right Atrium: Right atrial size was normal in size.  ? ?Pericardium: There is no evidence of pericardial effusion.  ? ?Mitral Valve:  Chordal systolic anterior motion of the mitral valve  ?apparatus. The mitral valve is abnormal. Trivial mitral valve  ?regurgitation. No evidence of mitral valve stenosis.  ? ?Tricuspid Valve: The tricuspid valve is normal in structure. Tricuspid  ?valve regurgitation is trivial. No evidence of tricuspid stenosis.  ? ?Aortic Valve: The aortic valve is tricuspid. Aortic valve regurgitation is  ?mild. No aortic stenosis is present.  ? ?Pulmonic Valve: The pulmonic valve was not well visualized. Pulmonic valve  ?regurgitation is trivial. No evidence of pulmonic stenosis.  ? ?Aorta: The aortic root, ascending aorta, aortic arch and descending aorta  ?are all structurally normal, with no evidence of dilitation or  ?obstruction.  ? ?Venous: The inferior vena cava is normal in size with greater than 50%  ?respiratory variability, suggesting right atrial pressure of 3 mmHg.  ? ?IAS/Shunts: The atrial septum is grossly normal.  ?_____________ ?  ?History of Present Illness   ?  ?Linda Pruitt is a 61 y.o. female with CAD s/p 4v CABG (LIMA-LAD, SVG-OM, SVG-RCA, and SVG-RI) '17, HTN, HLD, Stroke,  carotid artery stenosis who was seen 07/31/2021 for the evaluation of chest pain. ? ?She has been followed mostly through [facility] cardiology with [place] previously. She presented with an NSTEMI back in 2017 and underwent cardiac cath with severe multivessel CAD. Underwent 4v CABG 07/2015 with Dr. Freda Munro. EF was reported normal just several months prior to surgery. Several months later in 12/2015 underwent lap cholecystectomy. Suffered CVA in 04/2017. Also been followed by [facility] for bilateral carotid artery stenosis. Most recent has been no show visits at [facility] cardiology. She reports she has transferred her cardiology care to [facility] and has been followed by Dr. Claudie Leach.  Stated she had a stress test done within the past couple years, and most recently had an echocardiogram that was okay per her report. ?  ?Presented to the [facility]  ED with complaints of chest pain.  States she has had 2 weeks of intermittent chest pain.  She has used sublingual nitroglycerin off-and-on with improvement in her symptoms.  Episodes mostly calm with rest.  Stated that she thought this was related to GERD.  The night prior to admission she had a sudden onset of severe centralized chest pain with radiation into her shoulder.  Ultimately called her cousin who took her to [place] for further evaluation. ?  ?In the ED her labs showed sodium 145, potassium 2.6, creatinine 0.86, high-sensitivity troponin 231>>210, WBC 5, hemoglobin 12.8.  Chest x-ray negative.  EKG showed sinus rhythm, 65 bpm, severe LVH, biphasic T waves in lateral leads.  Case was discussed with overnight cardiology fellow with recommendations to transfer to [facility] for further management.  She was started on IV heparin, and nitro.  ? ?Hospital Course  ?    ?NSTEMI: somewhat atypical presentation.  hsTn 231>>210. Her chest pain in part seem to be quite consistent with angina, however there was also reproducible chest wall pain.  Cath noted above with no culprit lesion. Was treated with 48 hrs of IV heparin. No recurrent chest pain. Placed on Imdur post cath. ?-- continue ASA, added plavix, BB, ACEi, amlodipine, statin ? ?  CAD s/p CABG x4: Severe occlusive negative CAD with widely patent 4 out of 4 grafts. ?No culprit lesion noted with recommendations for medical therapy. Echo showed normal EF and no significant wall motion abnormalities. ?-- on aspirin, statin, amlodipine, carvedilol and high dose statin  ?  ?NSVT: brief intermittent episodes. Mag replaced ?-- coreg increased from 12.5mg  to 25mg  BID ? ?Essential hypertension: blood pressures were quite elevated on admission. Improved with adjustments  ?-- continue amlodipine 10mg , coreg 25mg  BID, lisinopril 20mg  daily and Imdur 30mg  daily ?Would probably consider spironolactone as next choice is a diuretic for blood pressure control. ? ?Hx of  Stroke: continue ASA and statin  ? ?Hypokalemia: K+ 2.6 on admission, supplemented with improvement ?-- unclear etiology ?-- recommend BMET at follow up with cardiologist  ? ?HLD: LDL 123 ?-- starte

## 2021-08-01 NOTE — Assessment & Plan Note (Addendum)
Brief salvos of 4-7 beats noted on telemetry overnight.  Very well likely related to intercurrent ischemic event.  Had been on 25 mg twice daily of Coreg, 12.5 mg p.o. ordered here.  We will increase back to 25 mg twice daily and monitor closely. ? ?Echo showed normal EF, but with significant native CAD, there is clearly some ischemic component. ? ?Would like to see more stable blood pressure and less prominent salvos prior to discharge. ?

## 2021-08-01 NOTE — Progress Notes (Signed)
ANTICOAGULATION CONSULT NOTE ? ?Pharmacy Consult for heparin ?Indication: chest pain/ACS ? ?Allergies  ?Allergen Reactions  ? Hydromorphone Other (See Comments)  ?  Hallucinations in combination with Morphine ?Hallucinations in combination with Morphine ?  ? Morphine Other (See Comments)  ?  Hallucinations in combination with Dilaudid ?Hallucinations in combination with Dilaudid ?  ? Iodinated Contrast Media Other (See Comments)  ?  Unknown reaction per pt ? ?  ? Latex Other (See Comments)  ?  Unknown reaction  ? Mixed Ragweed Other (See Comments)  ?  sneezing ?sneezing ?  ? Tape Other (See Comments)  ?  Pulled skin off  ? Vicodin [Hydrocodone-Acetaminophen] Nausea And Vomiting and Rash  ? ? ?Patient Measurements: ?Height: 5\' 5"  (165.1 cm) ?Weight: 90.7 kg (200 lb) ?IBW/kg (Calculated) : 57 ?Heparin Dosing Weight: 80kg ? ?Vital Signs: ?Temp: 97.7 ?F (36.5 ?C) (05/02 [id number]) ?Temp Source: Oral (05/02 0432) ?BP: 155/117 (05/02 0432) ?Pulse Rate: 81 (05/02 0432) ? ?Labs: ?Recent Labs  ?  07/31/21 ?0359 07/31/21 ?0616 07/31/21 ?1549 08/01/21 ?0255  ?HGB 12.8  --  11.6* 12.4  ?HCT 39.6  --  34.0* 38.6  ?PLT 245  --   --  240  ?APTT  --  32  --   --   ?LABPROT  --  13.0  --   --   ?INR  --  1.0  --   --   ?HEPARINUNFRC  --   --   --  0.20*  ?CREATININE 0.86  --   --  1.01*  ?TROPONINIHS 231* 210*  --   --   ? ? ? ?Estimated Creatinine Clearance: 65.9 mL/min (A) (by C-G formula based on SCr of 1.01 mg/dL (H)). ? ? ?Medical History: ?Past Medical History:  ?Diagnosis Date  ? Coronary artery disease   ? Hx of CABG   ? Hypertension   ? Stroke [facility])   ? ? ?Assessment: ?61yo female s/p cath 5/1. Patient is not on anticoagulation PTA.  ? ?Heparin level subtherapeutic: 0.20, no issues with infusion or overt s/sx of bleeding. CBC stable ? ?Goal of Therapy:  ?Heparin level 0.3-0.7 units/ml ?Monitor platelets by anticoagulation protocol: Yes ?  ?Plan:  ?No bolus with recent cath. ?Increase heparin to 1300 units/hr  ?Check daily  heparin level ?Monitor daily CBC, s/sx bleeding ? ? ?Georga Bora, PharmD ?Clinical Pharmacist ?08/01/2021 5:29 AM ?Please check AMION for all [place] numbers ? ?

## 2021-08-01 NOTE — Assessment & Plan Note (Signed)
Unusual presentation.  Her chest pain in part seem to be quite consistent with angina, however there was also reproducible chest wall pain.  Echo did not show any evidence of reduced EF to suggest myocarditis as etiology.  It is quite possible that she had small vessel ischemic from her native arteries which are diffusely diseased. ? ?Plan will be to complete 48 hours of IV heparin, continue aspirin but start Plavix for 6 months. ?Continue beta-blocker, statin, ACE inhibitor. ?

## 2021-08-01 NOTE — Plan of Care (Signed)
  Problem: Activity: Goal: Ability to return to baseline activity level will improve Outcome: Progressing   Problem: Cardiovascular: Goal: Ability to achieve and maintain adequate cardiovascular perfusion will improve Outcome: Progressing Goal: Vascular access site(s) Level 0-1 will be maintained Outcome: Progressing   

## 2021-08-02 ENCOUNTER — Other Ambulatory Visit (HOSPITAL_COMMUNITY): Payer: Self-pay

## 2021-08-02 DIAGNOSIS — E876 Hypokalemia: Secondary | ICD-10-CM

## 2021-08-02 LAB — CBC
HCT: 39.6 % (ref 36.0–46.0)
Hemoglobin: 12.8 g/dL (ref 12.0–15.0)
MCH: 27.4 pg (ref 26.0–34.0)
MCHC: 32.3 g/dL (ref 30.0–36.0)
MCV: 84.8 fL (ref 80.0–100.0)
Platelets: 232 10*3/uL (ref 150–400)
RBC: 4.67 MIL/uL (ref 3.87–5.11)
RDW: 14.3 % (ref 11.5–15.5)
WBC: 15.4 10*3/uL — ABNORMAL HIGH (ref 4.0–10.5)
nRBC: 0 % (ref 0.0–0.2)

## 2021-08-02 LAB — HEPARIN LEVEL (UNFRACTIONATED): Heparin Unfractionated: 0.33 IU/mL (ref 0.30–0.70)

## 2021-08-02 MED ORDER — ISOSORBIDE MONONITRATE ER 30 MG PO TB24
30.0000 mg | ORAL_TABLET | Freq: Every day | ORAL | 0 refills | Status: AC
  Filled 2021-08-02: qty 90, 90d supply, fill #0

## 2021-08-02 MED ORDER — CLOPIDOGREL BISULFATE 75 MG PO TABS
75.0000 mg | ORAL_TABLET | Freq: Every day | ORAL | 0 refills | Status: AC
Start: 2021-08-03 — End: ?
  Filled 2021-08-02: qty 90, 90d supply, fill #0

## 2021-08-02 MED ORDER — MAGNESIUM SULFATE 2 GM/50ML IV SOLN
2.0000 g | Freq: Once | INTRAVENOUS | Status: AC
  Administered 2021-08-02: 2 g via INTRAVENOUS
  Filled 2021-08-02: qty 50

## 2021-08-02 MED ORDER — ATORVASTATIN CALCIUM 40 MG PO TABS
40.0000 mg | ORAL_TABLET | Freq: Every day | ORAL | 0 refills | Status: AC
Start: 2021-08-03 — End: ?
  Filled 2021-08-02: qty 90, 90d supply, fill #0

## 2021-08-02 MED ORDER — CARVEDILOL 25 MG PO TABS: 25.0000 mg | ORAL_TABLET | Freq: Two times a day (BID) | ORAL | 0 refills | Status: AC

## 2021-08-02 MED ORDER — CARVEDILOL 25 MG PO TABS: 25.0000 mg | ORAL_TABLET | Freq: Two times a day (BID) | ORAL | 0 refills | Status: DC

## 2021-08-02 MED FILL — Heparin Sod (Porcine)-NaCl IV Soln 1000 Unit/500ML-0.9%: INTRAVENOUS | Qty: 1000 | Status: AC

## 2021-08-02 NOTE — Progress Notes (Signed)
ANTICOAGULATION CONSULT NOTE ? ?Pharmacy Consult for heparin ?Indication: chest pain/ACS ? ?Allergies  ?Allergen Reactions  ? Hydromorphone Other (See Comments)  ?  Hallucinations in combination with Morphine ?Hallucinations in combination with Morphine ?  ? Morphine Other (See Comments)  ?  Hallucinations in combination with Dilaudid ?Hallucinations in combination with Dilaudid ?  ? Iodinated Contrast Media Other (See Comments)  ?  Unknown reaction per pt ? ?  ? Latex Other (See Comments)  ?  Unknown reaction  ? Mixed Ragweed Other (See Comments)  ?  sneezing ?sneezing ?  ? Tape Other (See Comments)  ?  Pulled skin off  ? Vicodin [Hydrocodone-Acetaminophen] Nausea And Vomiting and Rash  ? ? ? ?Patient Measurements: ?Height: 5\' 5"  (165.1 cm) ?Weight: 90.7 kg (200 lb) ?IBW/kg (Calculated) : 57 ?Heparin Dosing Weight: 80kg ? ?Vital Signs: ?Temp: 98.8 ?F (37.1 ?C) (05/03 0445) ?Temp Source: Oral (05/03 0445) ?BP: 144/93 (05/03 0445) ?Pulse Rate: 75 (05/03 0445) ? ?Labs: ?Recent Labs  ?  07/31/21 ?0359 07/31/21 ?0616 07/31/21 ?1549 08/01/21 ?0255 08/01/21 ?1324 08/02/21 ?0242  ?HGB 12.8  --  11.6* 12.4  --  12.8  ?HCT 39.6  --  34.0* 38.6  --  39.6  ?PLT 245  --   --  240  --  232  ?APTT  --  32  --   --   --   --   ?LABPROT  --  13.0  --   --   --   --   ?INR  --  1.0  --   --   --   --   ?HEPARINUNFRC  --   --   --  0.20* 0.39 0.33  ?CREATININE 0.86  --   --  1.01*  --   --   ?TROPONINIHS 231* 210*  --   --   --   --   ? ? ?Estimated Creatinine Clearance: 65.9 mL/min (A) (by C-G formula based on SCr of 1.01 mg/dL (H)). ? ? ?Medical History: ?Past Medical History:  ?Diagnosis Date  ? Coronary artery disease   ? Hx of CABG   ? Hypertension   ? Stroke [facility])   ? ? ?Assessment: ?61yo female s/p cath 5/1 found to have occluded native vessels and patent grafts to the OM branch, ramus branch, distal right coronary artery and LIMA to the LAD. Cardiology recommended medical therapy. Patient is not on anticoagulation PTA.  Pharmacy consulted for heparin.   ? ?Heparin level 0.33 is therapeutic on 1300 units/hr. Stopping at 8am per team.  ? ? ?Goal of Therapy:  ?Heparin level 0.3-0.7 units/ml ?Monitor platelets by anticoagulation protocol: Yes ?  ?Plan:   ?Continue heparin to 1300 units/hr - stop placed for 8am   ? ? ?Benetta Spar, PharmD, BCPS, BCCP ?Clinical Pharmacist ? ?Please check AMION for all [place] phone numbers ?After 10:00 PM, call [place] [phone] ? ?

## 2021-08-02 NOTE — Plan of Care (Signed)
  Problem: Activity: Goal: Ability to return to baseline activity level will improve Outcome: Progressing   Problem: Cardiovascular: Goal: Ability to achieve and maintain adequate cardiovascular perfusion will improve Outcome: Progressing Goal: Vascular access site(s) Level 0-1 will be maintained Outcome: Progressing   

## 2021-08-02 NOTE — Progress Notes (Signed)
Pt had complaints of chest pain. EKG performed no acute findings. VS are stable and BP trending down. Pt stated pain is mid chest with heaviness to back 6/10. Pt has Imdur 30 mg ordered for chest pain to start in AM. Pt given dose at this time. Dr. Joyce Gross with cardiology notified about pt.  ?

## 2021-08-17 ENCOUNTER — Other Ambulatory Visit (HOSPITAL_COMMUNITY): Payer: Self-pay

## 2021-08-17 ENCOUNTER — Telehealth (HOSPITAL_COMMUNITY): Payer: Self-pay

## 2021-08-17 NOTE — Telephone Encounter (Signed)
Pharmacy Transitions of Care Follow-up Telephone Call  Date of discharge: 08/02/21  Discharge Diagnosis: NSTEMI  How have you been since you were released from the hospital? Patient reports doing well but some fatigue.    Medication changes made at discharge: START taking: atorvastatin (LIPITOR)  clopidogrel (PLAVIX)  isosorbide mononitrate (IMDUR)  CHANGE how you take: carvedilol (COREG)  STOP taking: cephALEXin 500 MG capsule (KEFLEX)  cyclobenzaprine 10 MG tablet (FLEXERIL)  ibuprofen 600 MG tablet (ADVIL)  ondansetron 4 MG tablet (ZOFRAN)  oxyCODONE-acetaminophen 5-325 MG tablet (Percocet  Medication changes verified by the patient? Yes    Medication Accessibility:  Home Pharmacy: [id number] -- [place]   Was the patient provided with refills on discharged medications? No   Have all prescriptions been transferred from [facility] to home pharmacy? N/A   Is the patient able to afford medications? Yes Notable copays: $4/30 days supply   Medication Review:  CLOPIDOGREL (PLAVIX) Clopidogrel 75 mg once daily.  - Reviewed potential DDIs with patient  - Advised patient of medications to avoid (NSAIDs, ASA)  - Educated that Tylenol (acetaminophen) will be the preferred analgesic to prevent risk of bleeding  - Emphasized importance of monitoring for signs and symptoms of bleeding (abnormal bruising, prolonged bleeding, nose bleeds, bleeding from gums, discolored urine, black tarry stools)  - Advised patient to alert all providers of anticoagulation therapy prior to starting a new medication or having a procedure.  Follow-up Appointments:  PCP Hospital f/u appt confirmed?  Went to provider last week. F/u w/ PCP later this month (5/27 and 5/29).   If their condition worsens, is the pt aware to call PCP or go to the Emergency Dept.? Yes  Final Patient Assessment: -Pt is doing well.  -Pt verbalized understanding of clopidogrel.  -Declined patient education -Pt has post discharge  appointment and knows to ask PCP for new scripts to be sent to [facility].

## 2022-03-08 ENCOUNTER — Other Ambulatory Visit (HOSPITAL_COMMUNITY): Payer: Self-pay

## 2022-10-24 IMAGING — DX DG CHEST 1V PORT
1 series · 1 of 1 positions shown · non-contrast
Comparison: 12/08/2020

CLINICAL DATA: Chest pain that started at 7 or 8 o'clock

EXAM:
PORTABLE CHEST 1 VIEW

[chest ap]
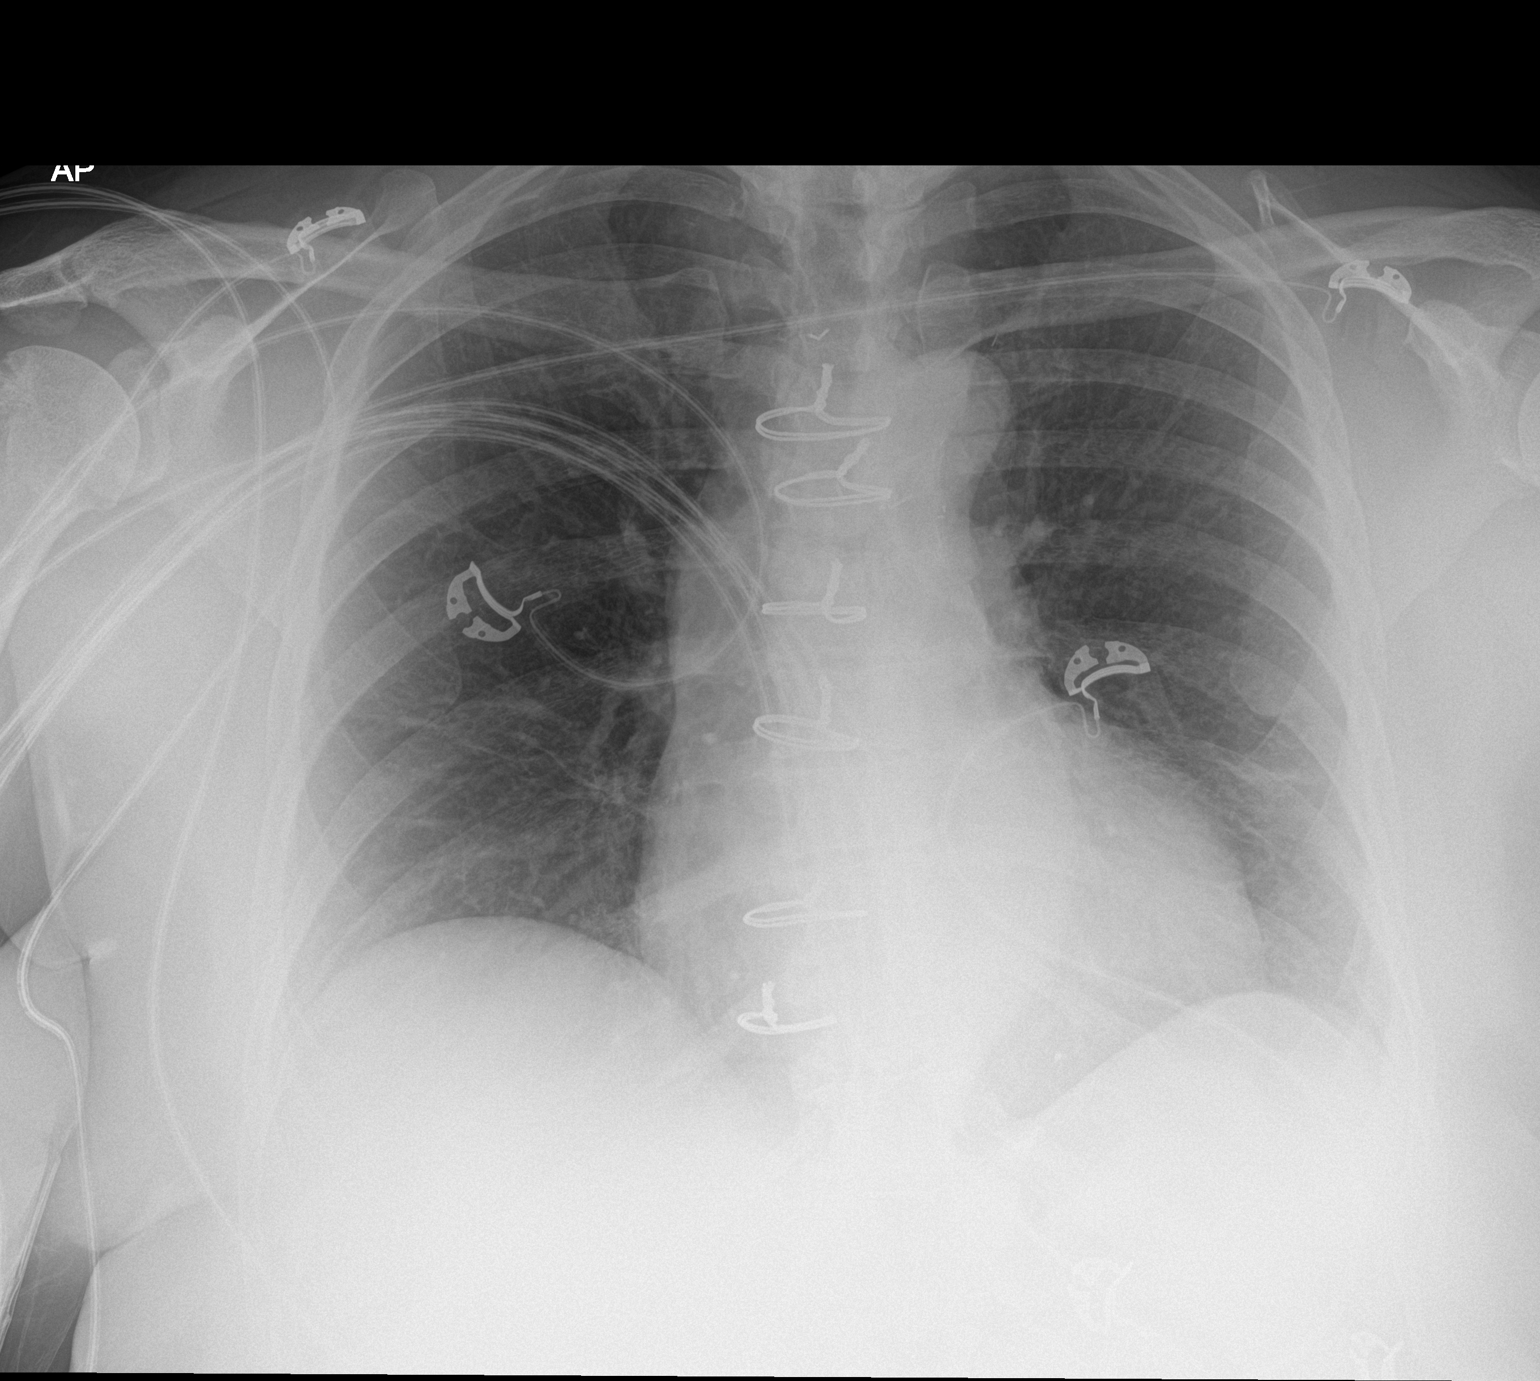

[1 of 1 positions shown; findings below may reference images not displayed]

FINDINGS: Artifact from EKG leads.

Cardiomegaly and aortic tortuosity. Prior median sternotomy. Fine
interstitial coarsening which is chronic based on prior. Mild linear
scarring at the lingula. There is no edema, consolidation, effusion,
or pneumothorax.
IMPRESSION: Stable exam.  No acute finding.
